# Patient Record
Sex: Female | Born: 1985 | Race: White | Hispanic: No | Marital: Married | Smoking: Never smoker
Health system: Southern US, Community
[De-identification: ages and names within clinical notes are randomized; demographics above are authoritative.]

## PROBLEM LIST (undated history)

## (undated) ENCOUNTER — Inpatient Hospital Stay (HOSPITAL_COMMUNITY): Payer: Self-pay

---
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
## 2015-12-06 NOTE — L&D Delivery Note (Signed)
Delivery Note At 3:46 AM a healthy female was delivered via Vaginal, Spontaneous Delivery (Presentation: ROA).  APGAR: 7, 9; weight pending .  Placenta status: S/I.  Cord: 3V with the following complications: None.  Cord pH: n/a  Anesthesia:  Epidural Episiotomy: None Lacerations:  2nd degree Suture Repair: 3.0 vicryl Est. Blood Loss (mL): 400  Mom to postpartum.  Baby to Couplet care / Skin to Skin.  Paula [name] 09/29/2016, 4:37 AM

## 2016-03-15 LAB — OB RESULTS CONSOLE HEPATITIS B SURFACE ANTIGEN: Hepatitis B Surface Ag: NEGATIVE

## 2016-03-15 LAB — OB RESULTS CONSOLE GC/CHLAMYDIA
Chlamydia: NEGATIVE
Gonorrhea: NEGATIVE

## 2016-03-15 LAB — OB RESULTS CONSOLE RPR: RPR: NONREACTIVE

## 2016-03-15 LAB — OB RESULTS CONSOLE RUBELLA ANTIBODY, IGM: RUBELLA: IMMUNE

## 2016-03-15 LAB — OB RESULTS CONSOLE ANTIBODY SCREEN: Antibody Screen: NEGATIVE

## 2016-03-15 LAB — OB RESULTS CONSOLE HIV ANTIBODY (ROUTINE TESTING): HIV: NONREACTIVE

## 2016-03-15 LAB — OB RESULTS CONSOLE ABO/RH: RH TYPE: POSITIVE

## 2016-09-21 ENCOUNTER — Encounter (HOSPITAL_COMMUNITY): Payer: Self-pay | Admitting: *Deleted

## 2016-09-21 ENCOUNTER — Telehealth (HOSPITAL_COMMUNITY): Payer: Self-pay | Admitting: *Deleted

## 2016-09-21 LAB — OB RESULTS CONSOLE GBS: STREP GROUP B AG: NEGATIVE

## 2016-09-21 NOTE — Telephone Encounter (Signed)
Preadmission screen  

## 2016-09-22 ENCOUNTER — Telehealth (HOSPITAL_COMMUNITY): Payer: Self-pay | Admitting: *Deleted

## 2016-09-22 ENCOUNTER — Inpatient Hospital Stay (HOSPITAL_COMMUNITY)
Admission: AD | Admit: 2016-09-22 | Discharge: 2016-09-22 | Disposition: A | Discharge status: Home / Self Care | Payer: BLUE CROSS/BLUE SHIELD | Source: Ambulatory Visit | Attending: Obstetrics and Gynecology | Admitting: Obstetrics and Gynecology

## 2016-09-22 ENCOUNTER — Encounter (HOSPITAL_COMMUNITY): Payer: Self-pay | Admitting: *Deleted

## 2016-09-22 DIAGNOSIS — Z3493 Encounter for supervision of normal pregnancy, unspecified, third trimester: Secondary | ICD-10-CM | POA: Diagnosis not present

## 2016-09-22 DIAGNOSIS — Z3A39 39 weeks gestation of pregnancy: Secondary | ICD-10-CM | POA: Insufficient documentation

## 2016-09-22 NOTE — MAU Note (Signed)
PT  SAYS  HER UC HURT BAD  SINCE  9PM.    VE IN OFFICE  ON Tuesday - 1   CM.     DENIES  HSV AND  MRSA.  GBS-   UNSURE

## 2016-09-22 NOTE — Telephone Encounter (Signed)
Preadmission screen  

## 2016-09-22 NOTE — Discharge Instructions (Signed)
Braxton Hicks Contractions °Contractions of the uterus can occur throughout pregnancy. Contractions are not always a sign that you are in labor.  °WHAT ARE BRAXTON HICKS CONTRACTIONS?  °Contractions that occur before labor are called Braxton Hicks contractions, or false labor. Toward the end of pregnancy (32-34 weeks), these contractions can develop more often and may become more forceful. This is not true labor because these contractions do not result in opening (dilatation) and thinning of the cervix. They are sometimes difficult to tell apart from true labor because these contractions can be forceful and people have different pain tolerances. You should not feel embarrassed if you go to the hospital with false labor. Sometimes, the only way to tell if you are in true labor is for your health care provider to look for changes in the cervix. °If there are no prenatal problems or other health problems associated with the pregnancy, it is completely safe to be sent home with false labor and await the onset of true labor. °HOW CAN YOU TELL THE DIFFERENCE BETWEEN TRUE AND FALSE LABOR? °False Labor °· The contractions of false labor are usually shorter and not as hard as those of true labor.   °· The contractions are usually irregular.   °· The contractions are often felt in the front of the lower abdomen and in the groin.   °· The contractions may go away when you walk around or change positions while lying down.   °· The contractions get weaker and are shorter lasting as time goes on.   °· The contractions do not usually become progressively stronger, regular, and closer together as with true labor.   °True Labor °· Contractions in true labor last 30-70 seconds, become very regular, usually become more intense, and increase in frequency.   °· The contractions do not go away with walking.   °· The discomfort is usually felt in the top of the uterus and spreads to the lower abdomen and low back.   °· True labor can be  determined by your health care provider with an exam. This will show that the cervix is dilating and getting thinner.   °WHAT TO REMEMBER °· Keep up with your usual exercises and follow other instructions given by your health care provider.   °· Take medicines as directed by your health care provider.   °· Keep your regular prenatal appointments.   °· Eat and drink lightly if you think you are going into labor.   °· If Braxton Hicks contractions are making you uncomfortable:   °¨ Change your position from lying down or resting to walking, or from walking to resting.   °¨ Sit and rest in a tub of warm water.   °¨ Drink 2-3 glasses of water. Dehydration may cause these contractions.   °¨ Do slow and deep breathing several times an hour.   °WHEN SHOULD I SEEK IMMEDIATE MEDICAL CARE? °Seek immediate medical care if: °· Your contractions become stronger, more regular, and closer together.   °· You have fluid leaking or gushing from your vagina.   °· You have a fever.   °· You pass blood-tinged mucus.   °· You have vaginal bleeding.   °· You have continuous abdominal pain.   °· You have low back pain that you never had before.   °· You feel your baby's head pushing down and causing pelvic pressure.   °· Your baby is not moving as much as it used to.   °  °This information is not intended to replace advice given to you by your health care provider. Make sure you discuss any questions you have with your health care   provider. °  °Document Released: 11/21/2005 Document Revised: 11/26/2013 Document Reviewed: 09/02/2013 °Elsevier Interactive Patient Education ©2016 [employer] ° °

## 2016-09-23 ENCOUNTER — Encounter (HOSPITAL_COMMUNITY): Payer: Self-pay | Admitting: *Deleted

## 2016-09-23 ENCOUNTER — Inpatient Hospital Stay (HOSPITAL_COMMUNITY)
Admission: AD | Admit: 2016-09-23 | Discharge: 2016-09-23 | Disposition: A | Discharge status: Home / Self Care | Payer: BLUE CROSS/BLUE SHIELD | Source: Ambulatory Visit | Attending: Obstetrics and Gynecology | Admitting: Obstetrics and Gynecology

## 2016-09-23 DIAGNOSIS — Z3493 Encounter for supervision of normal pregnancy, unspecified, third trimester: Secondary | ICD-10-CM | POA: Diagnosis not present

## 2016-09-23 DIAGNOSIS — Z3A40 40 weeks gestation of pregnancy: Secondary | ICD-10-CM | POA: Diagnosis not present

## 2016-09-23 NOTE — MAU Note (Addendum)
PT  HAS RETURNED  FROM  WED-  SAYS UC ARE  STRONGER  BUT  NOT CLOSE  TOGETHER .   PT  SAYS  SHE IS REALLY CONCERNED  ABOUT  BROWN  D/C - WHEN SHE CALLED ON- RN - WAS TOLD  TO COME AND GET EXAMINED.

## 2016-09-23 NOTE — MAU Note (Signed)
Was seen MAU early Thurs am and was 2cm and sent home. Cont to have some contractions but denies LOF. Having some brown mucousy d/c

## 2016-09-23 NOTE — Discharge Instructions (Signed)
Braxton Hicks Contractions °Contractions of the uterus can occur throughout pregnancy. Contractions are not always a sign that you are in labor.  °WHAT ARE BRAXTON HICKS CONTRACTIONS?  °Contractions that occur before labor are called Braxton Hicks contractions, or false labor. Toward the end of pregnancy (32-34 weeks), these contractions can develop more often and may become more forceful. This is not true labor because these contractions do not result in opening (dilatation) and thinning of the cervix. They are sometimes difficult to tell apart from true labor because these contractions can be forceful and people have different pain tolerances. You should not feel embarrassed if you go to the hospital with false labor. Sometimes, the only way to tell if you are in true labor is for your health care provider to look for changes in the cervix. °If there are no prenatal problems or other health problems associated with the pregnancy, it is completely safe to be sent home with false labor and await the onset of true labor. °HOW CAN YOU TELL THE DIFFERENCE BETWEEN TRUE AND FALSE LABOR? °False Labor °· The contractions of false labor are usually shorter and not as hard as those of true labor.   °· The contractions are usually irregular.   °· The contractions are often felt in the front of the lower abdomen and in the groin.   °· The contractions may go away when you walk around or change positions while lying down.   °· The contractions get weaker and are shorter lasting as time goes on.   °· The contractions do not usually become progressively stronger, regular, and closer together as with true labor.   °True Labor °· Contractions in true labor last 30-70 seconds, become very regular, usually become more intense, and increase in frequency.   °· The contractions do not go away with walking.   °· The discomfort is usually felt in the top of the uterus and spreads to the lower abdomen and low back.   °· True labor can be  determined by your health care provider with an exam. This will show that the cervix is dilating and getting thinner.   °WHAT TO REMEMBER °· Keep up with your usual exercises and follow other instructions given by your health care provider.   °· Take medicines as directed by your health care provider.   °· Keep your regular prenatal appointments.   °· Eat and drink lightly if you think you are going into labor.   °· If Braxton Hicks contractions are making you uncomfortable:   °¨ Change your position from lying down or resting to walking, or from walking to resting.   °¨ Sit and rest in a tub of warm water.   °¨ Drink 2-3 glasses of water. Dehydration may cause these contractions.   °¨ Do slow and deep breathing several times an hour.   °WHEN SHOULD I SEEK IMMEDIATE MEDICAL CARE? °Seek immediate medical care if: °· Your contractions become stronger, more regular, and closer together.   °· You have fluid leaking or gushing from your vagina.   °· You have a fever.   °· You pass blood-tinged mucus.   °· You have vaginal bleeding.   °· You have continuous abdominal pain.   °· You have low back pain that you never had before.   °· You feel your baby's head pushing down and causing pelvic pressure.   °· Your baby is not moving as much as it used to.   °  °This information is not intended to replace advice given to you by your health care provider. Make sure you discuss any questions you have with your health care   provider. °  °Document Released: 11/21/2005 Document Revised: 11/26/2013 Document Reviewed: 09/02/2013 °Elsevier Interactive Patient Education ©2016 [employer] ° °

## 2016-09-28 ENCOUNTER — Inpatient Hospital Stay (HOSPITAL_COMMUNITY): Payer: BLUE CROSS/BLUE SHIELD | Admitting: Anesthesiology

## 2016-09-28 ENCOUNTER — Inpatient Hospital Stay (HOSPITAL_COMMUNITY)
Admission: RE | Admit: 2016-09-28 | Discharge: 2016-09-30 | DRG: 775 | Disposition: A | Discharge status: Home / Self Care | Payer: BLUE CROSS/BLUE SHIELD | Source: Ambulatory Visit | Attending: Obstetrics & Gynecology | Admitting: Obstetrics & Gynecology

## 2016-09-28 ENCOUNTER — Encounter (HOSPITAL_COMMUNITY): Payer: Self-pay

## 2016-09-28 DIAGNOSIS — Z3490 Encounter for supervision of normal pregnancy, unspecified, unspecified trimester: Secondary | ICD-10-CM

## 2016-09-28 DIAGNOSIS — Z3403 Encounter for supervision of normal first pregnancy, third trimester: Secondary | ICD-10-CM | POA: Diagnosis present

## 2016-09-28 DIAGNOSIS — Z8249 Family history of ischemic heart disease and other diseases of the circulatory system: Secondary | ICD-10-CM | POA: Diagnosis not present

## 2016-09-28 DIAGNOSIS — Z3A39 39 weeks gestation of pregnancy: Secondary | ICD-10-CM | POA: Diagnosis not present

## 2016-09-28 LAB — CBC
HEMATOCRIT: 35 % — AB (ref 36.0–46.0)
Hemoglobin: 12.2 g/dL (ref 12.0–15.0)
MCH: 30 pg (ref 26.0–34.0)
MCHC: 34.9 g/dL (ref 30.0–36.0)
MCV: 86 fL (ref 78.0–100.0)
Platelets: 346 10*3/uL (ref 150–400)
RBC: 4.07 MIL/uL (ref 3.87–5.11)
RDW: 14.9 % (ref 11.5–15.5)
WBC: 15.7 10*3/uL — ABNORMAL HIGH (ref 4.0–10.5)

## 2016-09-28 LAB — TYPE AND SCREEN
ABO/RH(D): A POS
Antibody Screen: NEGATIVE

## 2016-09-28 LAB — ABO/RH: ABO/RH(D): A POS

## 2016-09-28 MED ORDER — DIPHENHYDRAMINE HCL 50 MG/ML IJ SOLN: 12.5000 mg | INTRAMUSCULAR | Status: DC | PRN

## 2016-09-28 MED ORDER — PHENYLEPHRINE 40 MCG/ML (10ML) SYRINGE FOR IV PUSH (FOR BLOOD PRESSURE SUPPORT)
80.0000 ug | PREFILLED_SYRINGE | INTRAVENOUS | Status: DC | PRN
  Filled 2016-09-28: qty 10
  Filled 2016-09-28: qty 5

## 2016-09-28 MED ORDER — ONDANSETRON HCL 4 MG/2ML IJ SOLN: 4.0000 mg | Freq: Four times a day (QID) | INTRAMUSCULAR | Status: DC | PRN

## 2016-09-28 MED ORDER — EPHEDRINE 5 MG/ML INJ
10.0000 mg | INTRAVENOUS | Status: DC | PRN
  Filled 2016-09-28: qty 4

## 2016-09-28 MED ORDER — LACTATED RINGERS IV SOLN
500.0000 mL | Freq: Once | INTRAVENOUS | Status: AC
  Administered 2016-09-28: 500 mL via INTRAVENOUS

## 2016-09-28 MED ORDER — LIDOCAINE HCL (PF) 1 % IJ SOLN
INTRAMUSCULAR | Status: DC | PRN
  Administered 2016-09-28 (×2): 4 mL

## 2016-09-28 MED ORDER — LIDOCAINE HCL (PF) 1 % IJ SOLN
30.0000 mL | INTRAMUSCULAR | Status: DC | PRN
  Filled 2016-09-28: qty 30

## 2016-09-28 MED ORDER — FLEET ENEMA 7-19 GM/118ML RE ENEM: 1.0000 | ENEMA | RECTAL | Status: DC | PRN

## 2016-09-28 MED ORDER — OXYTOCIN 40 UNITS IN LACTATED RINGERS INFUSION - SIMPLE MED
2.5000 [IU]/h | INTRAVENOUS | Status: DC
  Administered 2016-09-29: 2.5 [IU]/h via INTRAVENOUS

## 2016-09-28 MED ORDER — OXYTOCIN 40 UNITS IN LACTATED RINGERS INFUSION - SIMPLE MED
1.0000 m[IU]/min | INTRAVENOUS | Status: DC
  Administered 2016-09-28: 2 m[IU]/min via INTRAVENOUS
  Filled 2016-09-28: qty 1000

## 2016-09-28 MED ORDER — FENTANYL 2.5 MCG/ML BUPIVACAINE 1/10 % EPIDURAL INFUSION (WH - ANES)
14.0000 mL/h | INTRAMUSCULAR | Status: DC | PRN
  Administered 2016-09-28: 12.5 mL/h via EPIDURAL
  Administered 2016-09-28 – 2016-09-29 (×2): 14 mL/h via EPIDURAL
  Filled 2016-09-28 (×3): qty 125

## 2016-09-28 MED ORDER — OXYCODONE-ACETAMINOPHEN 5-325 MG PO TABS: 2.0000 | ORAL_TABLET | ORAL | Status: DC | PRN

## 2016-09-28 MED ORDER — ACETAMINOPHEN 325 MG PO TABS: 650.0000 mg | ORAL_TABLET | ORAL | Status: DC | PRN

## 2016-09-28 MED ORDER — PHENYLEPHRINE 40 MCG/ML (10ML) SYRINGE FOR IV PUSH (FOR BLOOD PRESSURE SUPPORT)
80.0000 ug | PREFILLED_SYRINGE | INTRAVENOUS | Status: DC | PRN
  Filled 2016-09-28: qty 5

## 2016-09-28 MED ORDER — LACTATED RINGERS IV SOLN
INTRAVENOUS | Status: DC
  Administered 2016-09-28 (×2): via INTRAVENOUS

## 2016-09-28 MED ORDER — OXYCODONE-ACETAMINOPHEN 5-325 MG PO TABS: 1.0000 | ORAL_TABLET | ORAL | Status: DC | PRN

## 2016-09-28 MED ORDER — SOD CITRATE-CITRIC ACID 500-334 MG/5ML PO SOLN: 30.0000 mL | ORAL | Status: DC | PRN

## 2016-09-28 MED ORDER — TERBUTALINE SULFATE 1 MG/ML IJ SOLN
0.2500 mg | Freq: Once | INTRAMUSCULAR | Status: DC | PRN
  Filled 2016-09-28: qty 1

## 2016-09-28 MED ORDER — FENTANYL CITRATE (PF) 100 MCG/2ML IJ SOLN: 50.0000 ug | INTRAMUSCULAR | Status: DC | PRN

## 2016-09-28 MED ORDER — LACTATED RINGERS IV SOLN
500.0000 mL | INTRAVENOUS | Status: DC | PRN
  Administered 2016-09-29: 250 mL via INTRAVENOUS

## 2016-09-28 MED ORDER — OXYTOCIN BOLUS FROM INFUSION
500.0000 mL | Freq: Once | INTRAVENOUS | Status: AC
  Administered 2016-09-29: 500 mL via INTRAVENOUS

## 2016-09-28 NOTE — Anesthesia Pain Management Evaluation Note (Signed)
  CRNA Pain Management Visit Note  Patient: Paula [name], 30 y.o., female  "Hello I am a member of the anesthesia team at [facility]. We have an anesthesia team available at all times to provide care throughout the hospital, including epidural management and anesthesia for C-section. I don't know your plan for the delivery whether it a natural birth, water birth, IV sedation, nitrous supplementation, doula or epidural, but we want to meet your pain goals."   1.Was your pain managed to your expectations on prior hospitalizations?   No prior hospitalizations  2.What is your expectation for pain management during this hospitalization?     Epidural  3.How can we help you reach that goal? unsure  Record the patient's initial score and the patient's pain goal.   Pain: 0  Pain Goal: 7 [facility] wants you to be able to say your pain was always managed very well.  Cephus ShellingBURGER,Paula [name] 09/28/2016

## 2016-09-28 NOTE — H&P (Addendum)
Legrand RamsKatherine [name] is a 30 y.o. female presenting for elective IOL at term.  Antepartum course uncomplicated.  GBS negative.   OB History    Gravida Para Term Preterm AB Living   1             SAB TAB Ectopic Multiple Live Births                 History reviewed. No pertinent past medical history. History reviewed. No pertinent surgical history. Family History: family history includes Hypertension in her mother. Social History:  reports that she has never smoked. She has never used smokeless tobacco. She reports that she does not drink alcohol or use drugs.     Maternal Diabetes: No Genetic Screening: Normal Maternal Ultrasounds/Referrals: Normal Fetal Ultrasounds or other Referrals:  None Maternal Substance Abuse:  No Significant Maternal Medications:  None Significant Maternal Lab Results:  Lab values include: Group B Strep negative Other Comments:  None  ROS Maternal Medical History:  Fetal activity: Perceived fetal activity is normal.   Last perceived fetal movement was within the past hour.    Prenatal complications: no prenatal complications Prenatal Complications - Diabetes: none.    Dilation: 3 Effacement (%): 100 Station: -2 Exam by:: Mance Vallejo  AROM clear  Blood pressure 131/85, pulse 84, resp. rate 18, height 5' (1.524 m), weight 150 lb (68 kg), last menstrual period 12/24/2015. Maternal Exam:  Uterine Assessment: Contraction strength is mild.  Contraction frequency is rare.   Abdomen: Patient reports no abdominal tenderness. Fundal height is c/w dates.   Estimated fetal weight is 7#12.   Fetal presentation: vertex  Introitus: Normal vulva. Amniotic fluid character: clear.  Pelvis: adequate for delivery.   Cervix: Cervix evaluated by digital exam.     Physical Exam  Constitutional: She is oriented to person, place, and time. She appears well-developed and well-nourished.  GI: Soft. There is no rebound and no guarding.  Neurological: She is alert and  oriented to person, place, and time.  Skin: Skin is warm and dry.  Psychiatric: She has a normal mood and affect. Her behavior is normal.    Prenatal labs: ABO, Rh: A/Positive/-- (04/11 0000) Antibody: Negative (04/11 0000) Rubella: Immune (04/11 0000) RPR: Nonreactive (04/11 0000)  HBsAg: Negative (04/11 0000)  HIV: Non-reactive (04/11 0000)  GBS: Negative (10/18 0000)   Assessment/Plan: 29yo G1 at 10399w6d for IOL -Pitocin -Epidural when ready -Anticipate NSVD   Paula [name] 09/28/2016, 7:52 AM

## 2016-09-28 NOTE — Anesthesia Preprocedure Evaluation (Signed)
Anesthesia Evaluation  Patient identified by MRN, date of birth, ID band Patient awake    Reviewed: Allergy & Precautions, NPO status , Patient's Chart, lab work & pertinent test results  History of Anesthesia Complications Negative for: history of anesthetic complications  Airway Mallampati: II  TM Distance: >3 FB Neck ROM: Full    Dental no notable dental hx. (+) Dental Advisory Given   Pulmonary neg pulmonary ROS,    Pulmonary exam normal breath sounds clear to auscultation       Cardiovascular negative cardio ROS Normal cardiovascular exam Rhythm:Regular Rate:Normal     Neuro/Psych negative neurological ROS  negative psych ROS   GI/Hepatic negative GI ROS, Neg liver ROS,   Endo/Other  negative endocrine ROS  Renal/GU negative Renal ROS  negative genitourinary   Musculoskeletal negative musculoskeletal ROS (+)   Abdominal   Peds negative pediatric ROS (+)  Hematology negative hematology ROS (+)   Anesthesia Other Findings   Reproductive/Obstetrics (+) Pregnancy                             Anesthesia Physical Anesthesia Plan  ASA: II  Anesthesia Plan: Epidural   Post-op Pain Management:    Induction:   Airway Management Planned:   Additional Equipment:   Intra-op Plan:   Post-operative Plan:   Informed Consent: I have reviewed the patients History and Physical, chart, labs and discussed the procedure including the risks, benefits and alternatives for the proposed anesthesia with the patient or authorized representative who has indicated his/her understanding and acceptance.   Dental advisory given  Plan Discussed with: CRNA  Anesthesia Plan Comments:         Anesthesia Quick Evaluation  

## 2016-09-28 NOTE — Anesthesia Procedure Notes (Signed)
Epidural Patient location during procedure: OB  Staffing Anesthesiologist: Angelin Cutrone Performed: anesthesiologist   Preanesthetic Checklist Completed: patient identified, site marked, surgical consent, pre-op evaluation, timeout performed, IV checked, risks and benefits discussed and monitors and equipment checked  Epidural Patient position: sitting Prep: site prepped and draped and DuraPrep Patient monitoring: continuous pulse ox and blood pressure Approach: midline Location: L3-L4 Injection technique: LOR saline  Needle:  Needle type: Tuohy  Needle gauge: 17 G Needle length: 9 cm and 9 Needle insertion depth: 5 cm cm Catheter type: closed end flexible Catheter size: 19 Gauge Catheter at skin depth: 10 cm Test dose: negative  Assessment Events: blood not aspirated, injection not painful, no injection resistance, negative IV test and no paresthesia  Additional Notes Patient identified. Risks/Benefits/Options discussed with patient including but not limited to bleeding, infection, nerve damage, paralysis, failed block, incomplete pain control, headache, blood pressure changes, nausea, vomiting, reactions to medication both or allergic, itching and postpartum back pain. Confirmed with bedside nurse the patient's most recent platelet count. Confirmed with patient that they are not currently taking any anticoagulation, have any bleeding history or any family history of bleeding disorders. Patient expressed understanding and wished to proceed. All questions were answered. Sterile technique was used throughout the entire procedure. Please see nursing notes for vital signs. Test dose was given through epidural catheter and negative prior to continuing to dose epidural or start infusion. Warning signs of high block given to the patient including shortness of breath, tingling/numbness in hands, complete motor block, or any concerning symptoms with instructions to call for help. Patient was  given instructions on fall risk and not to get out of bed. All questions and concerns addressed with instructions to call with any issues or inadequate analgesia.        

## 2016-09-29 ENCOUNTER — Inpatient Hospital Stay (HOSPITAL_COMMUNITY): Admission: RE | Admit: 2016-09-29 | Payer: BLUE CROSS/BLUE SHIELD | Source: Ambulatory Visit

## 2016-09-29 ENCOUNTER — Encounter (HOSPITAL_COMMUNITY): Payer: Self-pay

## 2016-09-29 LAB — RPR: RPR: NONREACTIVE

## 2016-09-29 MED ORDER — OXYCODONE-ACETAMINOPHEN 5-325 MG PO TABS: 1.0000 | ORAL_TABLET | ORAL | Status: DC | PRN

## 2016-09-29 MED ORDER — IBUPROFEN 600 MG PO TABS
600.0000 mg | ORAL_TABLET | Freq: Four times a day (QID) | ORAL | Status: DC
  Administered 2016-09-29 – 2016-09-30 (×6): 600 mg via ORAL
  Filled 2016-09-29 (×6): qty 1

## 2016-09-29 MED ORDER — DIPHENHYDRAMINE HCL 25 MG PO CAPS: 25.0000 mg | ORAL_CAPSULE | Freq: Four times a day (QID) | ORAL | Status: DC | PRN

## 2016-09-29 MED ORDER — BENZOCAINE-MENTHOL 20-0.5 % EX AERO
1.0000 "application " | INHALATION_SPRAY | CUTANEOUS | Status: DC | PRN
  Administered 2016-09-30: 1 via TOPICAL
  Filled 2016-09-29: qty 56

## 2016-09-29 MED ORDER — ONDANSETRON HCL 4 MG/2ML IJ SOLN: 4.0000 mg | INTRAMUSCULAR | Status: DC | PRN

## 2016-09-29 MED ORDER — WITCH HAZEL-GLYCERIN EX PADS: 1.0000 "application " | MEDICATED_PAD | CUTANEOUS | Status: DC | PRN

## 2016-09-29 MED ORDER — ONDANSETRON HCL 4 MG PO TABS: 4.0000 mg | ORAL_TABLET | ORAL | Status: DC | PRN

## 2016-09-29 MED ORDER — ACETAMINOPHEN 325 MG PO TABS: 650.0000 mg | ORAL_TABLET | ORAL | Status: DC | PRN

## 2016-09-29 MED ORDER — SIMETHICONE 80 MG PO CHEW: 80.0000 mg | CHEWABLE_TABLET | ORAL | Status: DC | PRN

## 2016-09-29 MED ORDER — TETANUS-DIPHTH-ACELL PERTUSSIS 5-2.5-18.5 LF-MCG/0.5 IM SUSP: 0.5000 mL | Freq: Once | INTRAMUSCULAR | Status: DC

## 2016-09-29 MED ORDER — ZOLPIDEM TARTRATE 5 MG PO TABS: 5.0000 mg | ORAL_TABLET | Freq: Every evening | ORAL | Status: DC | PRN

## 2016-09-29 MED ORDER — SENNOSIDES-DOCUSATE SODIUM 8.6-50 MG PO TABS
2.0000 | ORAL_TABLET | ORAL | Status: DC
  Administered 2016-09-29: 2 via ORAL
  Filled 2016-09-29: qty 2

## 2016-09-29 MED ORDER — OXYCODONE-ACETAMINOPHEN 5-325 MG PO TABS: 2.0000 | ORAL_TABLET | ORAL | Status: DC | PRN

## 2016-09-29 MED ORDER — COCONUT OIL OIL: 1.0000 "application " | TOPICAL_OIL | Status: DC | PRN

## 2016-09-29 MED ORDER — DIBUCAINE 1 % RE OINT: 1.0000 "application " | TOPICAL_OINTMENT | RECTAL | Status: DC | PRN

## 2016-09-29 MED ORDER — PRENATAL MULTIVITAMIN CH
1.0000 | ORAL_TABLET | Freq: Every day | ORAL | Status: DC
  Administered 2016-09-29 – 2016-09-30 (×2): 1 via ORAL
  Filled 2016-09-29 (×3): qty 1

## 2016-09-29 NOTE — Anesthesia Postprocedure Evaluation (Signed)
Anesthesia Post Note  Patient: Paula [name]  Procedure(s) Performed: * No procedures listed *  Patient location during evaluation: Mother Baby Anesthesia Type: Epidural Level of consciousness: awake and alert Pain management: pain level controlled Vital Signs Assessment: post-procedure vital signs reviewed and stable Respiratory status: spontaneous breathing, nonlabored ventilation and respiratory function stable Cardiovascular status: stable Postop Assessment: no headache, no backache and epidural receding Anesthetic complications: no     Last Vitals:  Vitals:   09/29/16 0645 09/29/16 1100  BP: (!) 143/84 (!) 106/58  Pulse: 76 80  Resp: 20 18  Temp: 37.3 C 37.1 C    Last Pain:  Vitals:   09/29/16 1105  TempSrc:   PainSc: 0-No pain   Pain Goal:                 Keyah Blizard

## 2016-09-29 NOTE — Progress Notes (Signed)
Post Partum Day 0 Subjective: no complaints, up ad lib, voiding, tolerating PO and + flatus Denies HA, blurred vision Objective: Blood pressure (!) 143/84, pulse 76, temperature 99.2 F (37.3 C), resp. rate 20, height 5' (1.524 m), weight 150 lb (68 kg), last menstrual period 12/24/2015, SpO2 98 %, unknown if currently breastfeeding.  Physical Exam:  General: alert and cooperative Lochia: appropriate Uterine Fundus: firm Incision: healing well, labial edema noted DVT Evaluation: No evidence of DVT seen on physical exam. Negative Homan's sign. No cords or calf tenderness. No significant calf/ankle edema.   Recent Labs  09/28/16 0745  HGB 12.2  HCT 35.0*    Assessment/Plan: Plan for discharge tomorrow and Breastfeeding   LOS: 1 day   Kerrie Timm G 09/29/2016, 8:07 AM

## 2016-09-29 NOTE — Lactation Note (Signed)
This note was copied from a baby's chart. Lactation Consultation Note  Patient Name: Paula [name] RamsKatherine Bitting UJWJX'BToday's Date: 09/29/2016 Reason for consult: Initial assessment  Visited with G1P1 Mom and FOB, baby 8 hrs old.  Baby sleeping STS on Mom's chest.   Mom has compressible areola, and flat nipples that do evert with stimulation a little.  Assisted with trying to entice baby to latch, as baby hasn't latched yet since birth.  Demonstrated breast massage, and hand expression to Mom.  A drop of colostrum expressed, and Mom put on baby's lips.  Baby becomes fussy and screams when trying to help her latch.  Mom sandwiching breast well to facilitate an areolar latch.  Teaching done while assisting.  Tried in football hold as well as laid back positions, and cross cradle.  Baby not interested at this point.  Discussed breast shells when she puts her bra on.  Demonstrated how to put these on.  Talked about pumping if baby continues to be tired and not interested in latching.  Mom wanting to rest at present.  Encouraged massage and hand expression to stimulate her colostrum.  Baby only calm when left to sleep on Mom's chest. Brochure given and told parents about Lactation services available to them.  Encouraged them to call when baby starts cueing to feed.  Encouraged STS and cue based feedings. Lactation to assist and follow-up prn and tomorrow.  Consult Status Consult Status: Follow-up Date: 09/30/16 Follow-up type: In-patient    Judee ClaraSmith, Junko Ohagan E 09/29/2016, 12:02 PM

## 2016-09-29 NOTE — Lactation Note (Signed)
This note was copied from a baby's chart. Lactation Consultation Note: Mother paged for latch assistance. Infant lying in cradle hold in mothers arms.  Assist mother in positioning infant in cross-cradle hold for better support.  Advised mother to use both hand for good support.  Infant latched on for a few mins  Mother aware of a good latch with rhythmic suckling Infant on and off so we switched to football hold on the alternate breast Infant latched on using a tea-cup hold. Sustained latch on and off for 5 mins. Mother describes good strong tugging.  Lots of teaching with mother on cue base feeding and positioning.  Mother receptive to all teaching. Advised to cue base feed at least 8-12 times in 24 hours.   Patient Name: Girl Legrand RamsKatherine Casaus WUJWJ'XToday's Date: 09/29/2016 Reason for consult: Initial assessment   Maternal Data Formula Feeding for Exclusion: No Has patient been taught Hand Expression?: Yes Does the patient have breastfeeding experience prior to this delivery?: No  Feeding Feeding Type: Breast Fed  LATCH Score/Interventions Latch: Too sleepy or reluctant, no latch achieved, no sucking elicited. Intervention(s): Skin to skin;Teach feeding cues;Waking techniques  Audible Swallowing: None Intervention(s): Skin to skin;Hand expression  Type of Nipple: Flat Intervention(s): Shells;Reverse pressure;Hand pump  Comfort (Breast/Nipple): Soft / non-tender     Hold (Positioning): Assistance needed to correctly position infant at breast and maintain latch. Intervention(s): Breastfeeding basics reviewed;Support Pillows;Position options;Skin to skin  LATCH Score: 4  Lactation Tools Discussed/Used WIC Program: No   Consult Status Consult Status: Follow-up Date: 09/30/16 Follow-up type: In-patient    Stevan BornKendrick, Diahann Guajardo [facility] 09/29/2016, 1:48 PM

## 2016-09-30 LAB — CBC
HEMATOCRIT: 28.8 % — AB (ref 36.0–46.0)
HEMOGLOBIN: 10 g/dL — AB (ref 12.0–15.0)
MCH: 29.9 pg (ref 26.0–34.0)
MCHC: 34.7 g/dL (ref 30.0–36.0)
MCV: 86.2 fL (ref 78.0–100.0)
Platelets: 309 10*3/uL (ref 150–400)
RBC: 3.34 MIL/uL — AB (ref 3.87–5.11)
RDW: 15 % (ref 11.5–15.5)
WBC: 11.7 10*3/uL — ABNORMAL HIGH (ref 4.0–10.5)

## 2016-09-30 MED ORDER — IBUPROFEN 600 MG PO TABS: 600.0000 mg | ORAL_TABLET | Freq: Four times a day (QID) | ORAL | 0 refills | Status: AC

## 2016-09-30 NOTE — Lactation Note (Signed)
This note was copied from a baby's chart. Lactation Consultation Note  Patient Name: Paula [name]: 09/30/2016 Reason for consult: Follow-up assessment  Visited with Mom on day of discharge, baby 5335 hrs old.  Mom complaining of some soreness with latch.  Baby's weight down 3.4% and had 4 stools, and 3 voids in last 24 hrs.  Baby very fussy at initial latch, not wanting to open widely.  Mom using football hold on right breast, and good breast and head support.  Mom very relaxed with baby.  After a few attempts, baby was able to attain a deep areolar latch, but after a few suck burst, fell asleep.  Baby switched to cross cradle hold on left breast.  Baby latched easily.  Showed Mom how to assess lower lip to be flanged, and how to tug on chin to uncurl it.  Baby initially non-nutritive, but after some alternate breast compression, became much more vigorous.  Identified swallows in the suck pattern for Mom to look for.  Baby fed for 30 minutes on right breast, and 15 minutes on left back in football hold.  No discomfort felt, and nipples look rounded after baby comes off.  Mom has a Medela PIS at home, and advised to pump following sleepy feedings with little swallowing noted.  Mom knows how to dropper/syringe feed baby her pumped colostrum.  STS recommended, and feeding often on cue.  Goal is to feed baby >8 times in 24 hrs.  Cluster feeding reviewed.  Engorgement prevention and treatment discussed.  Mom told about OP lactation services available to her.  OP appointment made for 11/3 @ 1pm for reassurance, but may cancel if all goes well.     Consult Status Consult Status: Follow-up [name]: 10/07/16 Follow-up type: In-patient    Judee ClaraSmith, Tria Noguera E 09/30/2016, 4:21 PM

## 2016-09-30 NOTE — Progress Notes (Signed)
Post Partum Day 1 Subjective: no complaints, up ad lib, voiding, tolerating PO and + flatus  Objective: Blood pressure 115/75, pulse (!) 58, temperature 98.3 F (36.8 C), temperature source Oral, resp. rate 18, height 5' (1.524 m), weight 150 lb (68 kg), last menstrual period 12/24/2015, SpO2 99 %, unknown if currently breastfeeding.  Physical Exam:  General: alert and cooperative Lochia: appropriate Uterine Fundus: firm Incision: healing well DVT Evaluation: No evidence of DVT seen on physical exam. Negative Homan's sign. No cords or calf tenderness. No significant calf/ankle edema.   Recent Labs  09/28/16 0745 09/30/16 0539  HGB 12.2 10.0*  HCT 35.0* 28.8*    Assessment/Plan: Breastfeeding, undecided regarding discharge this am. Discharge teaching reviewed and brochure given   LOS: 2 days   Shilo Philipson G 09/30/2016, 8:19 AM

## 2016-10-01 NOTE — Discharge Summary (Signed)
Obstetric Discharge Summary Reason for Admission: induction of labor Prenatal Procedures: none Intrapartum Procedures: spontaneous vaginal delivery Postpartum Procedures: none Complications-Operative and Postpartum: none Hemoglobin  Date Value Ref Range Status  09/30/2016 10.0 (L) 12.0 - 15.0 g/dL Final   HCT  Date Value Ref Range Status  09/30/2016 28.8 (L) 36.0 - 46.0 % Final    Physical Exam:  General: alert Lochia: appropriate Uterine Fundus: firm Incision: healing well DVT Evaluation: No evidence of DVT seen on physical exam.  Discharge Diagnoses: Term Pregnancy-delivered  Discharge Information: Date: 10/01/2016 Activity: pelvic rest Diet: routine Medications: PNV and Ibuprofen Condition: stable Instructions: refer to practice specific booklet Discharge to: home Follow-up Information    [facility]. Schedule an appointment as soon as possible for a visit in 6 week(s).   Contact information: [address] [place] [address] [phone]           Newborn Data: Live born female  Birth Weight: 8 lb 15.9 oz (4080 g) APGAR: 7, 9  Home with mother.  Meriel PicaHOLLAND,Nela Bascom M 10/01/2016, 8:51 AM

## 2016-10-07 ENCOUNTER — Ambulatory Visit (HOSPITAL_COMMUNITY): Payer: BLUE CROSS/BLUE SHIELD

## 2019-09-03 ENCOUNTER — Other Ambulatory Visit: Payer: Self-pay

## 2019-09-03 DIAGNOSIS — Z20822 Contact with and (suspected) exposure to covid-19: Secondary | ICD-10-CM

## 2019-09-04 LAB — NOVEL CORONAVIRUS, NAA: SARS-CoV-2, NAA: NOT DETECTED

## 2019-10-10 ENCOUNTER — Other Ambulatory Visit: Payer: Self-pay | Admitting: Obstetrics and Gynecology

## 2019-10-10 DIAGNOSIS — N6320 Unspecified lump in the left breast, unspecified quadrant: Secondary | ICD-10-CM

## 2019-10-17 ENCOUNTER — Ambulatory Visit
Admission: RE | Admit: 2019-10-17 | Discharge: 2019-10-17 | Disposition: A | Discharge status: Home / Self Care | Payer: BC Managed Care – PPO | Source: Ambulatory Visit | Attending: Obstetrics and Gynecology | Admitting: Obstetrics and Gynecology

## 2019-10-17 ENCOUNTER — Other Ambulatory Visit: Payer: Self-pay

## 2019-10-17 DIAGNOSIS — N6320 Unspecified lump in the left breast, unspecified quadrant: Secondary | ICD-10-CM

## 2019-10-18 ENCOUNTER — Other Ambulatory Visit: Payer: BLUE CROSS/BLUE SHIELD

## 2019-10-22 ENCOUNTER — Other Ambulatory Visit: Payer: BLUE CROSS/BLUE SHIELD

## 2019-11-12 ENCOUNTER — Other Ambulatory Visit: Payer: Self-pay | Admitting: General Surgery

## 2019-11-12 DIAGNOSIS — N6452 Nipple discharge: Secondary | ICD-10-CM

## 2019-11-25 LAB — OB RESULTS CONSOLE ANTIBODY SCREEN: Antibody Screen: NEGATIVE

## 2019-11-25 LAB — OB RESULTS CONSOLE ABO/RH: RH Type: POSITIVE

## 2019-11-25 LAB — OB RESULTS CONSOLE RPR: RPR: NONREACTIVE

## 2019-11-25 LAB — OB RESULTS CONSOLE HIV ANTIBODY (ROUTINE TESTING): HIV: NONREACTIVE

## 2019-11-25 LAB — OB RESULTS CONSOLE HEPATITIS B SURFACE ANTIGEN: Hepatitis B Surface Ag: NEGATIVE

## 2019-11-25 LAB — OB RESULTS CONSOLE RUBELLA ANTIBODY, IGM: Rubella: IMMUNE

## 2019-12-13 ENCOUNTER — Other Ambulatory Visit: Payer: BC Managed Care – PPO

## 2020-01-06 ENCOUNTER — Other Ambulatory Visit: Payer: Self-pay | Admitting: General Surgery

## 2020-01-06 ENCOUNTER — Ambulatory Visit
Admission: RE | Admit: 2020-01-06 | Discharge: 2020-01-06 | Disposition: A | Discharge status: Home / Self Care | Payer: BC Managed Care – PPO | Source: Ambulatory Visit | Attending: General Surgery | Admitting: General Surgery

## 2020-01-06 DIAGNOSIS — N6452 Nipple discharge: Secondary | ICD-10-CM

## 2020-01-31 ENCOUNTER — Ambulatory Visit
Admission: RE | Admit: 2020-01-31 | Discharge: 2020-01-31 | Disposition: A | Discharge status: Home / Self Care | Payer: BC Managed Care – PPO | Source: Ambulatory Visit | Attending: General Surgery | Admitting: General Surgery

## 2020-01-31 DIAGNOSIS — N6452 Nipple discharge: Secondary | ICD-10-CM

## 2020-05-22 LAB — OB RESULTS CONSOLE GBS: GBS: NEGATIVE

## 2020-06-04 ENCOUNTER — Encounter (HOSPITAL_COMMUNITY): Payer: Self-pay | Admitting: *Deleted

## 2020-06-04 ENCOUNTER — Telehealth (HOSPITAL_COMMUNITY): Payer: Self-pay | Admitting: *Deleted

## 2020-06-04 NOTE — Telephone Encounter (Signed)
Preadmission screen  

## 2020-06-12 ENCOUNTER — Inpatient Hospital Stay (HOSPITAL_COMMUNITY): Payer: BC Managed Care – PPO

## 2020-06-13 ENCOUNTER — Other Ambulatory Visit (HOSPITAL_COMMUNITY)
Admission: RE | Admit: 2020-06-13 | Discharge: 2020-06-13 | Disposition: A | Discharge status: Home / Self Care | Payer: BC Managed Care – PPO | Source: Ambulatory Visit | Attending: Obstetrics and Gynecology | Admitting: Obstetrics and Gynecology

## 2020-06-13 DIAGNOSIS — Z01812 Encounter for preprocedural laboratory examination: Secondary | ICD-10-CM | POA: Insufficient documentation

## 2020-06-13 DIAGNOSIS — Z20822 Contact with and (suspected) exposure to covid-19: Secondary | ICD-10-CM | POA: Insufficient documentation

## 2020-06-13 LAB — SARS CORONAVIRUS 2 (TAT 6-24 HRS): SARS Coronavirus 2: NEGATIVE

## 2020-06-15 NOTE — H&P (Signed)
Carmeline Kowal is a 34 y.o. female presenting for IOL at term. Pregnancy complicated by 8# 15 oz baby with pregnancy #1. OB History    Gravida  2   Para  1   Term  1   Preterm      AB      Living  1     SAB      TAB      Ectopic      Multiple  0   Live Births  1          No past medical history on file. No past surgical history on file. Family History: family history includes Hypertension in her mother; Thyroid disease in her mother. Social History:  reports that she has never smoked. She has never used smokeless tobacco. She reports that she does not drink alcohol and does not use drugs.     Maternal Diabetes: No Genetic Screening: Normal Maternal Ultrasounds/Referrals: Normal Fetal Ultrasounds or other Referrals:  None Maternal Substance Abuse:  No Significant Maternal Medications:  None Significant Maternal Lab Results:  Group B Strep negative Other Comments:  None  Review of Systems  Eyes: Negative for visual disturbance.  Gastrointestinal: Negative for abdominal pain.  Neurological: Negative for headaches.   Maternal Medical History:  Fetal activity: Perceived fetal activity is normal.        Last menstrual period 09/13/2019, unknown if currently breastfeeding. Maternal Exam:  Abdomen: Fetal presentation: vertex     Physical Exam Cardiovascular:     Rate and Rhythm: Normal rate.  Pulmonary:     Effort: Pulmonary effort is normal.  Abdominal:     Tenderness: There is no abdominal tenderness.     Prenatal labs: ABO, Rh: A/Positive/-- (12/21 0000) Antibody: Negative (12/21 0000) Rubella: Immune (12/21 0000) RPR: Nonreactive (12/21 0000)  HBsAg: Negative (12/21 0000)  HIV: Non-reactive (12/21 0000)  GBS: Negative/-- (06/18 0000)   Assessment/Plan: 34 yo G2P1 for IOL   Roselle Locus II 06/15/2020, 6:33 PM

## 2020-06-16 ENCOUNTER — Inpatient Hospital Stay (HOSPITAL_COMMUNITY)
Admission: RE | Admit: 2020-06-16 | Discharge: 2020-06-18 | DRG: 807 | Disposition: A | Discharge status: Home / Self Care | Payer: BC Managed Care – PPO | Attending: Obstetrics and Gynecology | Admitting: Obstetrics and Gynecology

## 2020-06-16 ENCOUNTER — Inpatient Hospital Stay (HOSPITAL_COMMUNITY): Payer: BC Managed Care – PPO

## 2020-06-16 ENCOUNTER — Other Ambulatory Visit: Payer: Self-pay

## 2020-06-16 ENCOUNTER — Inpatient Hospital Stay (HOSPITAL_COMMUNITY): Payer: BC Managed Care – PPO | Admitting: Anesthesiology

## 2020-06-16 ENCOUNTER — Encounter (HOSPITAL_COMMUNITY): Payer: Self-pay | Admitting: Obstetrics and Gynecology

## 2020-06-16 DIAGNOSIS — O26893 Other specified pregnancy related conditions, third trimester: Secondary | ICD-10-CM | POA: Diagnosis present

## 2020-06-16 DIAGNOSIS — Z3A39 39 weeks gestation of pregnancy: Secondary | ICD-10-CM

## 2020-06-16 DIAGNOSIS — Z3490 Encounter for supervision of normal pregnancy, unspecified, unspecified trimester: Secondary | ICD-10-CM

## 2020-06-16 DIAGNOSIS — Z20822 Contact with and (suspected) exposure to covid-19: Secondary | ICD-10-CM | POA: Diagnosis present

## 2020-06-16 LAB — CBC
HCT: 33.5 % — ABNORMAL LOW (ref 36.0–46.0)
Hemoglobin: 11.1 g/dL — ABNORMAL LOW (ref 12.0–15.0)
MCH: 29.7 pg (ref 26.0–34.0)
MCHC: 33.1 g/dL (ref 30.0–36.0)
MCV: 89.6 fL (ref 80.0–100.0)
Platelets: 254 10*3/uL (ref 150–400)
RBC: 3.74 MIL/uL — ABNORMAL LOW (ref 3.87–5.11)
RDW: 13.3 % (ref 11.5–15.5)
WBC: 12.5 10*3/uL — ABNORMAL HIGH (ref 4.0–10.5)
nRBC: 0 % (ref 0.0–0.2)

## 2020-06-16 LAB — RPR: RPR Ser Ql: NONREACTIVE

## 2020-06-16 LAB — TYPE AND SCREEN
ABO/RH(D): A POS
Antibody Screen: NEGATIVE

## 2020-06-16 MED ORDER — LACTATED RINGERS IV SOLN: 500.0000 mL | INTRAVENOUS | Status: DC | PRN

## 2020-06-16 MED ORDER — WITCH HAZEL-GLYCERIN EX PADS: 1.0000 "application " | MEDICATED_PAD | CUTANEOUS | Status: DC | PRN

## 2020-06-16 MED ORDER — PHENYLEPHRINE 40 MCG/ML (10ML) SYRINGE FOR IV PUSH (FOR BLOOD PRESSURE SUPPORT): 80.0000 ug | PREFILLED_SYRINGE | INTRAVENOUS | Status: DC | PRN

## 2020-06-16 MED ORDER — COCONUT OIL OIL: 1.0000 "application " | TOPICAL_OIL | Status: DC | PRN

## 2020-06-16 MED ORDER — ACETAMINOPHEN 325 MG PO TABS: 650.0000 mg | ORAL_TABLET | ORAL | Status: DC | PRN

## 2020-06-16 MED ORDER — LACTATED RINGERS IV SOLN: INTRAVENOUS | Status: DC

## 2020-06-16 MED ORDER — TERBUTALINE SULFATE 1 MG/ML IJ SOLN: 0.2500 mg | Freq: Once | INTRAMUSCULAR | Status: DC | PRN

## 2020-06-16 MED ORDER — DIPHENHYDRAMINE HCL 50 MG/ML IJ SOLN: 12.5000 mg | INTRAMUSCULAR | Status: DC | PRN

## 2020-06-16 MED ORDER — SODIUM CHLORIDE (PF) 0.9 % IJ SOLN
INTRAMUSCULAR | Status: DC | PRN
  Administered 2020-06-16: 12 mL/h via EPIDURAL

## 2020-06-16 MED ORDER — FENTANYL-BUPIVACAINE-NACL 0.5-0.125-0.9 MG/250ML-% EP SOLN
EPIDURAL | Status: AC
  Filled 2020-06-16: qty 250

## 2020-06-16 MED ORDER — PRENATAL MULTIVITAMIN CH
1.0000 | ORAL_TABLET | Freq: Every day | ORAL | Status: DC
  Administered 2020-06-16 – 2020-06-17 (×2): 1 via ORAL
  Filled 2020-06-16 (×2): qty 1

## 2020-06-16 MED ORDER — ONDANSETRON HCL 4 MG/2ML IJ SOLN: 4.0000 mg | INTRAMUSCULAR | Status: DC | PRN

## 2020-06-16 MED ORDER — OXYCODONE HCL 5 MG PO TABS: 5.0000 mg | ORAL_TABLET | ORAL | Status: DC | PRN

## 2020-06-16 MED ORDER — OXYTOCIN BOLUS FROM INFUSION
333.0000 mL | Freq: Once | INTRAVENOUS | Status: AC
  Administered 2020-06-16: 333 mL via INTRAVENOUS

## 2020-06-16 MED ORDER — FENTANYL CITRATE (PF) 100 MCG/2ML IJ SOLN
50.0000 ug | INTRAMUSCULAR | Status: DC | PRN
  Administered 2020-06-16: 50 ug via INTRAVENOUS
  Filled 2020-06-16: qty 2

## 2020-06-16 MED ORDER — ONDANSETRON HCL 4 MG PO TABS: 4.0000 mg | ORAL_TABLET | ORAL | Status: DC | PRN

## 2020-06-16 MED ORDER — OXYTOCIN-SODIUM CHLORIDE 30-0.9 UT/500ML-% IV SOLN
2.5000 [IU]/h | INTRAVENOUS | Status: DC
  Administered 2020-06-16: 2.5 [IU]/h via INTRAVENOUS
  Filled 2020-06-16: qty 500

## 2020-06-16 MED ORDER — OXYCODONE-ACETAMINOPHEN 5-325 MG PO TABS: 1.0000 | ORAL_TABLET | ORAL | Status: DC | PRN

## 2020-06-16 MED ORDER — SENNOSIDES-DOCUSATE SODIUM 8.6-50 MG PO TABS
2.0000 | ORAL_TABLET | ORAL | Status: DC
  Administered 2020-06-16 – 2020-06-17 (×2): 2 via ORAL
  Filled 2020-06-16 (×2): qty 2

## 2020-06-16 MED ORDER — EPHEDRINE 5 MG/ML INJ: 10.0000 mg | INTRAVENOUS | Status: DC | PRN

## 2020-06-16 MED ORDER — LIDOCAINE HCL (PF) 1 % IJ SOLN: 30.0000 mL | INTRAMUSCULAR | Status: DC | PRN

## 2020-06-16 MED ORDER — TETANUS-DIPHTH-ACELL PERTUSSIS 5-2.5-18.5 LF-MCG/0.5 IM SUSP: 0.5000 mL | Freq: Once | INTRAMUSCULAR | Status: DC

## 2020-06-16 MED ORDER — FLEET ENEMA 7-19 GM/118ML RE ENEM: 1.0000 | ENEMA | RECTAL | Status: DC | PRN

## 2020-06-16 MED ORDER — BENZOCAINE-MENTHOL 20-0.5 % EX AERO: 1.0000 "application " | INHALATION_SPRAY | CUTANEOUS | Status: DC | PRN

## 2020-06-16 MED ORDER — ONDANSETRON HCL 4 MG/2ML IJ SOLN: 4.0000 mg | Freq: Four times a day (QID) | INTRAMUSCULAR | Status: DC | PRN

## 2020-06-16 MED ORDER — SOD CITRATE-CITRIC ACID 500-334 MG/5ML PO SOLN: 30.0000 mL | ORAL | Status: DC | PRN

## 2020-06-16 MED ORDER — IBUPROFEN 600 MG PO TABS
600.0000 mg | ORAL_TABLET | Freq: Four times a day (QID) | ORAL | Status: DC
  Administered 2020-06-16 – 2020-06-18 (×8): 600 mg via ORAL
  Filled 2020-06-16 (×8): qty 1

## 2020-06-16 MED ORDER — FENTANYL-BUPIVACAINE-NACL 0.5-0.125-0.9 MG/250ML-% EP SOLN: 12.0000 mL/h | EPIDURAL | Status: DC | PRN

## 2020-06-16 MED ORDER — OXYCODONE-ACETAMINOPHEN 5-325 MG PO TABS: 2.0000 | ORAL_TABLET | ORAL | Status: DC | PRN

## 2020-06-16 MED ORDER — MISOPROSTOL 25 MCG QUARTER TABLET
25.0000 ug | ORAL_TABLET | ORAL | Status: DC | PRN
  Administered 2020-06-16: 25 ug via VAGINAL
  Filled 2020-06-16: qty 1

## 2020-06-16 MED ORDER — DIBUCAINE (PERIANAL) 1 % EX OINT: 1.0000 "application " | TOPICAL_OINTMENT | CUTANEOUS | Status: DC | PRN

## 2020-06-16 MED ORDER — ZOLPIDEM TARTRATE 5 MG PO TABS: 5.0000 mg | ORAL_TABLET | Freq: Every evening | ORAL | Status: DC | PRN

## 2020-06-16 MED ORDER — LIDOCAINE-EPINEPHRINE (PF) 2 %-1:200000 IJ SOLN
INTRAMUSCULAR | Status: DC | PRN
  Administered 2020-06-16: 2 mL via EPIDURAL
  Administered 2020-06-16: 3 mL via EPIDURAL

## 2020-06-16 MED ORDER — OXYCODONE HCL 5 MG PO TABS: 10.0000 mg | ORAL_TABLET | ORAL | Status: DC | PRN

## 2020-06-16 MED ORDER — HYDROXYZINE HCL 50 MG PO TABS: 50.0000 mg | ORAL_TABLET | Freq: Four times a day (QID) | ORAL | Status: DC | PRN

## 2020-06-16 MED ORDER — DIPHENHYDRAMINE HCL 25 MG PO CAPS: 25.0000 mg | ORAL_CAPSULE | Freq: Four times a day (QID) | ORAL | Status: DC | PRN

## 2020-06-16 MED ORDER — SIMETHICONE 80 MG PO CHEW: 80.0000 mg | CHEWABLE_TABLET | ORAL | Status: DC | PRN

## 2020-06-16 MED ORDER — LACTATED RINGERS IV SOLN: 500.0000 mL | Freq: Once | INTRAVENOUS | Status: DC

## 2020-06-16 NOTE — Anesthesia Procedure Notes (Signed)
Epidural Patient location during procedure: OB Start time: 06/16/2020 6:00 AM End time: 06/16/2020 6:15 AM  Staffing Anesthesiologist: Elmer Picker, MD Performed: anesthesiologist   Preanesthetic Checklist Completed: patient identified, IV checked, risks and benefits discussed, monitors and equipment checked, pre-op evaluation and timeout performed  Epidural Patient position: sitting Prep: DuraPrep and site prepped and draped Patient monitoring: continuous pulse ox, blood pressure, heart rate and cardiac monitor Approach: midline Location: L3-L4 Injection technique: LOR air  Needle:  Needle type: Tuohy  Needle gauge: 17 G Needle length: 9 cm Needle insertion depth: 5 cm Catheter type: closed end flexible Catheter size: 19 Gauge Catheter at skin depth: 10 cm Test dose: negative  Assessment Sensory level: T8 Events: blood not aspirated, injection not painful, no injection resistance, no paresthesia and negative IV test  Additional Notes Patient identified. Risks/Benefits/Options discussed with patient including but not limited to bleeding, infection, nerve damage, paralysis, failed block, incomplete pain control, headache, blood pressure changes, nausea, vomiting, reactions to medication both or allergic, itching and postpartum back pain. Confirmed with bedside nurse the patient's most recent platelet count. Confirmed with patient that they are not currently taking any anticoagulation, have any bleeding history or any family history of bleeding disorders. Patient expressed understanding and wished to proceed. All questions were answered. Sterile technique was used throughout the entire procedure. Please see nursing notes for vital signs. Test dose was given through epidural catheter and negative prior to continuing to dose epidural or start infusion. Warning signs of high block given to the patient including shortness of breath, tingling/numbness in hands, complete motor block,  or any concerning symptoms with instructions to call for help. Patient was given instructions on fall risk and not to get out of bed. All questions and concerns addressed with instructions to call with any issues or inadequate analgesia.  Reason for block:procedure for pain

## 2020-06-16 NOTE — Progress Notes (Signed)
Delivery Note At 8:29 AM a viable female was delivered via Vaginal, Spontaneous (Presentation:   Occiput Anterior).  APGAR: 8, 9; weight  .   Placenta status: Spontaneous, Intact.  Cord: 3 vessels with the following complications: None.  Cord pH: pending  Anesthesia: Epidural Episiotomy: None Lacerations: 2nd degree small ML lac repaired Suture Repair: 2.0 vicryl rapide Est. Blood Loss (mL): 50  Mom to postpartum.  Baby to Couplet care / Skin to Skin.  Paula [name] II 06/16/2020, 8:40 AM

## 2020-06-16 NOTE — Anesthesia Postprocedure Evaluation (Signed)
Anesthesia Post Note  Patient: Paula [name]  Procedure(s) Performed: AN AD HOC LABOR EPIDURAL     Patient location during evaluation: Mother Baby Anesthesia Type: Epidural Level of consciousness: awake, awake and alert and oriented Pain management: pain level controlled Vital Signs Assessment: post-procedure vital signs reviewed and stable Respiratory status: spontaneous breathing, nonlabored ventilation and respiratory function stable Cardiovascular status: stable Postop Assessment: no headache, patient able to bend at knees, no apparent nausea or vomiting, adequate PO intake and able to ambulate Anesthetic complications: no   No complications documented.  Last Vitals:  Vitals:   06/16/20 1120 06/16/20 1525  BP: 117/74 98/77  Pulse: 74 71  Resp: 16 18  Temp:  37 C  SpO2: 98% 98%    Last Pain:  Vitals:   06/16/20 1525  TempSrc: Oral  PainSc:    Pain Goal:                   Issa Luster

## 2020-06-16 NOTE — Lactation Note (Signed)
This note was copied from a baby's chart. Lactation Consultation Note  Patient Name: Paula [name] RCVEL'F Date: 06/16/2020 Reason for consult: Initial assessment;Term  Initial assessment of 9 hours old baby Paula Careers adviser) of P2 mother with 12 months of breastfeeding experience. Baby is showing hunger cues upon arrival. Mother states breastfeeding is going well and does not reports any problems at this point. Mother latched baby on right breast cradle hold with ease using a nursing pillow. Observed good, deep latch with consistent suckling and audible swallowing. Encouraged mother to maintain good alignment (earn, shoulder, hip). Mother verbalized in agreement.   Reviewed with mother average size of a NB stomach. Encourage to follow babies' hunger and fullness cues. Reviewed importance to offer the breast 8 to 12 times in a 24-hour period for proper stimulation and to establish good milk supply.     Reviewed breastfeeding basics. Discussed milk coming to volume. Reviewed newborn behavior and expectations with mother and encouraged to contact [facility] for support, questions or concerns.    All questions answered at this time.   Maternal Data Formula Feeding for Exclusion: No Has patient been taught Hand Expression?: Yes Does the patient have breastfeeding experience prior to this delivery?: Yes  Feeding Feeding Type: Breast Fed  LATCH Score Latch: Grasps breast easily, tongue down, lips flanged, rhythmical sucking.  Audible Swallowing: Spontaneous and intermittent  Type of Nipple: Everted at rest and after stimulation  Comfort (Breast/Nipple): Soft / non-tender  Hold (Positioning): No assistance needed to correctly position infant at breast.  LATCH Score: 10  Interventions Interventions: Adjust position  Lactation Tools Discussed/Used WIC Program: No   Consult Status Consult Status: Follow-up Date: 06/17/20 Follow-up type: In-patient    Norton Bivins A Higuera  Ancidey 06/16/2020, 6:14 PM

## 2020-06-16 NOTE — Anesthesia Preprocedure Evaluation (Signed)
Anesthesia Evaluation  Patient identified by MRN, date of birth, ID band Patient awake    Reviewed: Allergy & Precautions, NPO status , Patient's Chart, lab work & pertinent test results  Airway Mallampati: II  TM Distance: >3 FB Neck ROM: Full    Dental no notable dental hx.    Pulmonary neg pulmonary ROS,    Pulmonary exam normal breath sounds clear to auscultation       Cardiovascular negative cardio ROS Normal cardiovascular exam Rhythm:Regular Rate:Normal     Neuro/Psych negative neurological ROS  negative psych ROS   GI/Hepatic negative GI ROS, Neg liver ROS,   Endo/Other  negative endocrine ROS  Renal/GU negative Renal ROS  negative genitourinary   Musculoskeletal negative musculoskeletal ROS (+)   Abdominal   Peds  Hematology negative hematology ROS (+)   Anesthesia Other Findings IOL for term gestation  Reproductive/Obstetrics (+) Pregnancy                             Anesthesia Physical Anesthesia Plan  ASA: II  Anesthesia Plan: Epidural   Post-op Pain Management:    Induction:   PONV Risk Score and Plan: Treatment may vary due to age or medical condition  Airway Management Planned: Natural Airway  Additional Equipment:   Intra-op Plan:   Post-operative Plan:   Informed Consent: I have reviewed the patients History and Physical, chart, labs and discussed the procedure including the risks, benefits and alternatives for the proposed anesthesia with the patient or authorized representative who has indicated his/her understanding and acceptance.       Plan Discussed with: Anesthesiologist  Anesthesia Plan Comments: (Patient identified. Risks, benefits, options discussed with patient including but not limited to bleeding, infection, nerve damage, paralysis, failed block, incomplete pain control, headache, blood pressure changes, nausea, vomiting, reactions to  medication, itching, and post partum back pain. Confirmed with bedside nurse the patient's most recent platelet count. Confirmed with the patient that they are not taking any anticoagulation, have any bleeding history or any family history of bleeding disorders. Patient expressed understanding and wishes to proceed. All questions were answered. )        Anesthesia Quick Evaluation

## 2020-06-17 LAB — CBC
HCT: 32.1 % — ABNORMAL LOW (ref 36.0–46.0)
Hemoglobin: 10.5 g/dL — ABNORMAL LOW (ref 12.0–15.0)
MCH: 28.9 pg (ref 26.0–34.0)
MCHC: 32.7 g/dL (ref 30.0–36.0)
MCV: 88.4 fL (ref 80.0–100.0)
Platelets: 250 10*3/uL (ref 150–400)
RBC: 3.63 MIL/uL — ABNORMAL LOW (ref 3.87–5.11)
RDW: 13.2 % (ref 11.5–15.5)
WBC: 11 10*3/uL — ABNORMAL HIGH (ref 4.0–10.5)
nRBC: 0 % (ref 0.0–0.2)

## 2020-06-17 NOTE — Progress Notes (Signed)
PPD # 1 Doing well BP 116/72 (BP Location: Left Arm)    Pulse 62    Temp 97.9 F (36.6 C) (Oral)    Resp 18    Ht 5' (1.524 m)    Wt 70 kg    LMP 09/13/2019    SpO2 100%    Breastfeeding Unknown    BMI 30.14 kg/m  Results for orders placed or performed during the hospital encounter of 06/16/20 (from the past 24 hour(s))  CBC     Status: Abnormal   Collection Time: 06/17/20  5:15 AM  Result Value Ref Range   WBC 11.0 (H) 4.0 - 10.5 K/uL   RBC 3.63 (L) 3.87 - 5.11 MIL/uL   Hemoglobin 10.5 (L) 12.0 - 15.0 g/dL   HCT 61.4 (L) 36 - 46 %   MCV 88.4 80.0 - 100.0 fL   MCH 28.9 26.0 - 34.0 pg   MCHC 32.7 30.0 - 36.0 g/dL   RDW 43.1 54.0 - 08.6 %   Platelets 250 150 - 400 K/uL   nRBC 0.0 0.0 - 0.2 %   Abdomen is soft and non tender  PPD # 1  Doing well Routine care discharge home tomorrow

## 2020-06-17 NOTE — Lactation Note (Signed)
This note was copied from a baby's chart. Lactation Consultation Note  Patient Name: Paula [name] Date: 06/17/2020 Reason for consult: Follow-up assessment  Baby is 49 1/2 hours old  Mom is an experienced breast feeder of 1 year ( now 34 years old )  Mom expressed she wasn't sure the baby was getting enough.  LC reviewed breast feeding basics and reassured her the baby was cluster feeding  And also may have not always had the depth when latched due to areola edema.  LC discussed and recommended reverse pressure prior to latching until the shells  Start to work.  LC instructed mom on the use of shells between feedings except when sleeping  and hand pump ( #24 F and #27 F ). LC recommended starting the shells today after a shower.  And the hand pump if for D/C.  Baby awake and mom latched the baby with minimal assist with the 1st latch and by her self with the second and her mentioned she was comfortable. Swallows noted and increased with breast compressions.  Baby satisfied after the feeding falling asleep.  Per mom will have a DEBP at home.   Maternal Data Has patient been taught Hand Expression?:  (mom able to hand express well prio to latch ) Does the patient have breastfeeding experience prior to this delivery?: Yes  Feeding Feeding Type: Breast Fed  LATCH Score Latch: Grasps breast easily, tongue down, lips flanged, rhythmical sucking.  Audible Swallowing: Spontaneous and intermittent  Type of Nipple: Everted at rest and after stimulation  Comfort (Breast/Nipple): Soft / non-tender  Hold (Positioning): No assistance needed to correctly position infant at breast.  LATCH Score: 10  Interventions Interventions: Breast feeding basics reviewed  Lactation Tools Discussed/Used WIC Program: No Pump Review: Setup, frequency, and cleaning Initiated by:: MAI  Date initiated:: 06/17/20   Consult Status Consult Status: Follow-up Date: 06/18/20 Follow-up  type: In-patient    Matilde Sprang Inocente Krach 06/17/2020, 10:41 AM

## 2020-06-17 NOTE — Progress Notes (Signed)
CSW received and acknowledges consult for EDPS of 9.  Consult screened out due to 9 on EDPS does not warrant a CSW consult.  MOB whom scores are greater than 9/yes to question 10 on Edinburgh Postpartum Depression Screen warrants a CSW consult.    Vashon Arch S. Chenee Munns, MSW, LCSW [facility] [phone]   

## 2020-06-18 NOTE — Lactation Note (Signed)
This note was copied from a baby's chart. Lactation Consultation Note  Patient Name: Paula [name] RCBUL'A Date: 06/18/2020   Infant is 48 hrs old. Infant was finishing a feeding as I entered room. Mom reports some nipple soreness with latching. Mom's nipple had a very slight crease when baby unlatched; we talked about possible causes. Specifics of an asymmetric latch were shown via [employer].   Mom reported that she noticed her breasts felt heavier & denser last night. Mom has excellent veining. Mom's R nipple is atraumatic. Her L nipple has a minute area of pinkness that must be tender to Mom.   Infant has only lost 20 g over the last 24 hrs despite having had 7 voids & 4 stools during that same time period. Mom affirms that stools are now yellow (as mentioned in chart). Mom said she had to supplement with her 1st child; I do not anticipate that she will need to do that this time.   Mom knows how to reach [place] for any post-discharge questions.  Lurline Hare [facility] 06/18/2020, 8:51 AM

## 2020-06-18 NOTE — Discharge Summary (Signed)
Postpartum Discharge Summary  Date of Service updated 06/18/20     Patient Name: Paula [name] DOB: 25-Jan-1986 MRN: [id number]  Date of admission: 06/16/2020 Delivery date:06/16/2020  Delivering provider: Everlene Farrier  Date of discharge: 06/18/2020  Admitting diagnosis: Term pregnancy [Z34.90] Intrauterine pregnancy: [redacted]w[redacted]d    Secondary diagnosis:  Active Problems:   Term pregnancy  Additional problems: none    Discharge diagnosis: Term Pregnancy Delivered                                              Post partum procedures:none Augmentation: AROM and Pitocin Complications: None  Hospital course: Induction of Labor With Vaginal Delivery   34y.o. yo G2P2002 at 34w4das admitted to the hospital 06/16/2020 for induction of labor.  Indication for induction: Favorable cervix at term.  Patient had an uncomplicated labor course as follows: Membrane Rupture Time/Date: 7:10 AM ,06/16/2020   Delivery Method:Vaginal, Spontaneous  Episiotomy: None  Lacerations:  2nd degree  Details of delivery can be found in separate delivery note.  Patient had a routine postpartum course. Patient is discharged home 06/18/20.  Newborn Data: Birth date:06/16/2020  Birth time:8:29 AM  Gender:Female  Living status:Living  Apgars:8 ,9  Weight:3605 g   Magnesium Sulfate received: No BMZ received: No Rhophylac:N/A MMR:N/A T-DaP:Given prenatally Flu: N/A Transfusion:No  Physical exam  Vitals:   06/17/20 0538 06/17/20 1523 06/17/20 2024 06/18/20 0517  BP: 116/72 117/71 116/69 106/61  Pulse: 62 68 60 67  Resp: _0 Temp: 97.9 F (36.6 C) 97.9 F (36.6 C) 98.3 F (36.8 C) 98.4 F (36.9 C)  TempSrc: Oral Oral Oral Oral  SpO2: 100% 98% 97% 98%  Weight:      Height:       General: alert Lochia: appropriate Uterine Fundus: firm Incision: Healing well with no significant drainage DVT Evaluation: No evidence of DVT seen on physical exam. Labs: Lab Results  Component Value Date    WBC 11.0 (H) 06/17/2020   HGB 10.5 (L) 06/17/2020   HCT 32.1 (L) 06/17/2020   MCV 88.4 06/17/2020   PLT 250 06/17/2020   No flowsheet data found. Edinburgh Score: Edinburgh Postnatal Depression Scale Screening Tool 06/17/2020  I have been able to laugh and see the funny side of things. 0  I have looked forward with enjoyment to things. 0  I have blamed myself unnecessarily when things went wrong. 1  I have been anxious or worried for no good reason. 2  I have felt scared or panicky for no good reason. 2  Things have been getting on top of me. 1  I have been so unhappy that I have had difficulty sleeping. 0  I have felt sad or miserable. 2  I have been so unhappy that I have been crying. 1  The thought of harming myself has occurred to me. 0  Edinburgh Postnatal Depression Scale Total 9      After visit meds:  Allergies as of 06/18/2020   No Known Allergies     Medication List    TAKE these medications   ibuprofen 600 MG tablet Commonly known as: ADVIL Take 1 tablet (600 mg total) by mouth every 6 (six) hours.   prenatal multivitamin Tabs tablet Take 1 tablet by mouth daily at 12 noon.        Discharge home  in stable condition Infant Feeding: Breast Infant Disposition:home with mother Discharge instruction: per After Visit Summary and Postpartum booklet. Activity: Advance as tolerated. Pelvic rest for 6 weeks.  Diet: routine diet Anticipated Birth Control: Unsure Postpartum Appointment:6 weeks Additional Postpartum F/U: none Future Appointments:No future appointments. Follow up Visit:      06/18/2020 Tyson Dense, MD

## 2021-04-18 ENCOUNTER — Emergency Department: Payer: BC Managed Care – PPO

## 2021-04-18 ENCOUNTER — Emergency Department
Admission: EM | Admit: 2021-04-18 | Discharge: 2021-04-19 | Disposition: A | Discharge status: Home / Self Care | Payer: BC Managed Care – PPO | Attending: Emergency Medicine | Admitting: Emergency Medicine

## 2021-04-18 ENCOUNTER — Other Ambulatory Visit: Payer: Self-pay

## 2021-04-18 DIAGNOSIS — M62231 Nontraumatic ischemic infarction of muscle, right forearm: Secondary | ICD-10-CM | POA: Diagnosis not present

## 2021-04-18 DIAGNOSIS — M62232 Nontraumatic ischemic infarction of muscle, left forearm: Secondary | ICD-10-CM | POA: Insufficient documentation

## 2021-04-18 DIAGNOSIS — R2233 Localized swelling, mass and lump, upper limb, bilateral: Secondary | ICD-10-CM | POA: Diagnosis present

## 2021-04-18 DIAGNOSIS — R748 Abnormal levels of other serum enzymes: Secondary | ICD-10-CM | POA: Diagnosis not present

## 2021-04-18 DIAGNOSIS — M6282 Rhabdomyolysis: Secondary | ICD-10-CM

## 2021-04-18 LAB — CBC WITH DIFFERENTIAL/PLATELET
Abs Immature Granulocytes: 0.04 10*3/uL (ref 0.00–0.07)
Basophils Absolute: 0.1 10*3/uL (ref 0.0–0.1)
Basophils Relative: 1 %
Eosinophils Absolute: 0.2 10*3/uL (ref 0.0–0.5)
Eosinophils Relative: 1 %
HCT: 39.3 % (ref 36.0–46.0)
Hemoglobin: 13 g/dL (ref 12.0–15.0)
Immature Granulocytes: 0 %
Lymphocytes Relative: 28 %
Lymphs Abs: 3 10*3/uL (ref 0.7–4.0)
MCH: 28.2 pg (ref 26.0–34.0)
MCHC: 33.1 g/dL (ref 30.0–36.0)
MCV: 85.2 fL (ref 80.0–100.0)
Monocytes Absolute: 0.6 10*3/uL (ref 0.1–1.0)
Monocytes Relative: 6 %
Neutro Abs: 6.8 10*3/uL (ref 1.7–7.7)
Neutrophils Relative %: 64 %
Platelets: 380 10*3/uL (ref 150–400)
RBC: 4.61 MIL/uL (ref 3.87–5.11)
RDW: 12.8 % (ref 11.5–15.5)
WBC: 10.6 10*3/uL — ABNORMAL HIGH (ref 4.0–10.5)
nRBC: 0 % (ref 0.0–0.2)

## 2021-04-18 LAB — COMPREHENSIVE METABOLIC PANEL WITH GFR
ALT: 74 U/L — ABNORMAL HIGH (ref 0–44)
AST: 166 U/L — ABNORMAL HIGH (ref 15–41)
Albumin: 4.6 g/dL (ref 3.5–5.0)
Alkaline Phosphatase: 60 U/L (ref 38–126)
Anion gap: 10 (ref 5–15)
BUN: 17 mg/dL (ref 6–20)
CO2: 22 mmol/L (ref 22–32)
Calcium: 9.3 mg/dL (ref 8.9–10.3)
Chloride: 105 mmol/L (ref 98–111)
Creatinine, Ser: 0.69 mg/dL (ref 0.44–1.00)
GFR, Estimated: >60 mL/min
Glucose, Bld: 148 mg/dL — ABNORMAL HIGH (ref 70–99)
Potassium: 3.2 mmol/L — ABNORMAL LOW (ref 3.5–5.1)
Sodium: 137 mmol/L (ref 135–145)
Total Bilirubin: 0.6 mg/dL (ref 0.3–1.2)
Total Protein: 7.8 g/dL (ref 6.5–8.1)

## 2021-04-18 LAB — CK: Total CK: 6045 U/L — ABNORMAL HIGH (ref 38–234)

## 2021-04-18 LAB — POC URINE PREG, ED: Preg Test, Ur: NEGATIVE

## 2021-04-18 MED ORDER — SODIUM CHLORIDE 0.9 % IV SOLN
1000.0000 mL | Freq: Once | INTRAVENOUS | Status: AC
  Administered 2021-04-18: 1000 mL via INTRAVENOUS

## 2021-04-18 NOTE — ED Provider Notes (Signed)
[facility] Emergency Department Provider Note   ____________________________________________    I have reviewed the triage vital signs and the nursing notes.   HISTORY  Chief Complaint Arm Swelling     HPI Kalila Adkison is a 35 y.o. female who presents with complaints of bilateral arm swelling.  Patient reports on Monday 6 days ago she went to a burn [employer] "arm day ".  She reports typically she has been a frequent exerciser however she has recently had a baby so she is not participated in some time.  She reports she was mildly sore during the week but then today in the afternoon noted swelling to her upper arms bilaterally as well as pain and cramping.  No other complaints.  Normal urine  No past medical history on file.  Patient Active Problem List   Diagnosis Date Noted  . Term pregnancy 06/16/2020  . Pregnancy 09/28/2016    Past Surgical History:  Procedure Laterality Date  . WISDOM TOOTH EXTRACTION  2006    Prior to Admission medications   Medication Sig Start Date End Date Taking? Authorizing Provider  ibuprofen (ADVIL,MOTRIN) 600 MG tablet Take 1 tablet (600 mg total) by mouth every 6 (six) hours. Patient not taking: Reported on 06/16/2020 09/30/16   Richarda Overlie, MD  Prenatal Vit-Fe Fumarate-FA (PRENATAL MULTIVITAMIN) TABS tablet Take 1 tablet by mouth daily at 12 noon.    [provider]     Allergies Patient has no known allergies.  Family History  Problem Relation Age of Onset  . Hypertension Mother   . Thyroid disease Mother     Social History Social History   Tobacco Use  . Smoking status: Never Smoker  . Smokeless tobacco: Never Used  Vaping Use  . Vaping Use: Never used  Substance Use Topics  . Alcohol use: No  . Drug use: No    Review of Systems  Constitutional: No fever/chills Eyes: No visual changes.  ENT: No sore throat. Cardiovascular: Denies chest pain. Respiratory: Denies  shortness of breath. Gastrointestinal: No abdominal pain.  Genitourinary: Negative for dysuria. Musculoskeletal: As above Skin: Negative for rash. Neurological: Negative for headaches or weakness   ____________________________________________   PHYSICAL EXAM:  VITAL SIGNS: ED Triage Vitals  Enc Vitals Group     BP 04/18/21 2119 (!) 156/103     Pulse Rate 04/18/21 2119 92     Resp 04/18/21 2119 16     Temp 04/18/21 2119 98.5 F (36.9 C)     Temp Source 04/18/21 2119 Oral     SpO2 04/18/21 2119 98 %     Weight 04/18/21 2120 59 kg (130 lb)     Height 04/18/21 2120 1.524 m (5')     Head Circumference --      Peak Flow --      Pain Score 04/18/21 2120 0     Pain Loc --      Pain Edu? --      Excl. in GC? --     Constitutional: Alert and oriented. No acute distress. Pleasant and interactive   Mouth/Throat: Mucous membranes are moist.    Cardiovascular: Normal rate, regular rhythm. Peri Jefferson peripheral circulation. Respiratory: Normal respiratory effort.  No retractions. Gastrointestinal: Soft and nontender. No distention.   Musculoskeletal: Mild swelling to the upper arms bilaterally, no erythema, warm and well perfused, normal pulses distally Neurologic:  Normal speech and language.  Skin:  Skin is warm, dry and intact. No rash  noted. Psychiatric: Mood and affect are normal. Speech and behavior are normal.  ____________________________________________   LABS (all labs ordered are listed, but only abnormal results are displayed)  Labs Reviewed  CK - Abnormal; Notable for the following components:      Result Value   Total CK 6,045 (*)    All other components within normal limits  CBC WITH DIFFERENTIAL/PLATELET - Abnormal; Notable for the following components:   WBC 10.6 (*)    All other components within normal limits  COMPREHENSIVE METABOLIC PANEL - Abnormal; Notable for the following components:   Potassium 3.2 (*)    Glucose, Bld 148 (*)    AST 166 (*)     ALT 74 (*)    All other components within normal limits  POC URINE PREG, ED   ____________________________________________  EKG   ____________________________________________  RADIOLOGY  Ultrasound reviewed by me ____________________________________________   PROCEDURES  Procedure(s) performed: No  Procedures   Critical Care performed: No ____________________________________________   INITIAL IMPRESSION / ASSESSMENT AND PLAN / ED COURSE  Pertinent labs & imaging results that were available during my care of the patient were reviewed by me and considered in my medical decision making (see chart for details).  Patient presents with mild swelling to the upper arms and soreness after significant workout.  She had noted that right arm was worse than the left, ultrasound obtained, results pending, reviewed by me, no clear evidence of DVT.  CK is elevated at 6000.  Creatinine is normal.  Will give 2 L of IV fluids and recheck CK afterwards.  If decreasing likely okay for discharge, if increasing will likely need admission  Have asked my colleague to follow-up on repeat CK    ____________________________________________   FINAL CLINICAL IMPRESSION(S) / ED DIAGNOSES  Final diagnoses:  Non-traumatic rhabdomyolysis        Note:  This document was prepared using Dragon voice recognition software and may include unintentional dictation errors.   Jene Every, MD 04/18/21 [phone]

## 2021-04-18 NOTE — ED Notes (Signed)
Spoke with dr. Derrill Kay regarding pt's presenting symptoms, orders received.

## 2021-04-18 NOTE — ED Triage Notes (Addendum)
Pt with right upper arm swelling that began at 1630 today. Pt now has the beginning of left upper arm swelling that began approx 20 min pta. Pt without fever, redness. No color change, sensation, pulse changed noted in extremities. Pt denies pain. Pt states did exercise arms last week.

## 2021-04-19 LAB — CK: Total CK: 4094 U/L — ABNORMAL HIGH (ref 38–234)

## 2021-04-19 MED ORDER — DIAZEPAM 2 MG PO TABS: 2.0000 mg | ORAL_TABLET | Freq: Three times a day (TID) | ORAL | 0 refills | Status: AC | PRN

## 2021-04-19 NOTE — Discharge Instructions (Addendum)
1.  You may take Tylenol and/or ibuprofen as needed for pain. 2.  You may take Valium as needed for muscle spasms.  Pump and dump the first ounce of breastmilk if you are taking Valium. 3.  Return to the ER for worsening symptoms, persistent vomiting, difficulty breathing or other concerns

## 2021-04-19 NOTE — ED Notes (Signed)
Pt presenting to ED with bilateral upper arm swelling that started earlier today. Pt's arms noted to be red and warm to the touch. R arm more swollen than L. Pt denies trauma to either arm. Pt's mom at bedside. AAO NAD VSS

## 2021-04-19 NOTE — ED Provider Notes (Signed)
-----------------------------------------   3:02 AM on 04/19/2021 -----------------------------------------  After completion of IV fluids, CK was repeated which is now trending down, 4094.  Patient feeling better and eager for discharge home.  Will prescribe low-dose Valium for muscle cramps.  Patient is breast-feeding; she will express first ounce of breastmilk prior to feeding.  Strict return precautions given.  Patient verbalizes understanding agrees with plan of care.   Irean Hong, MD 04/19/21 [phone]

## 2021-06-21 IMAGING — MR MR BREAST BILAT W/O CM
4 series · 28 of 48 positions shown · non-contrast
Comparison: Previous exam(s).

CLINICAL DATA: 33-year-old pregnant female (2nd trimester) with
LEFT nipple discharge.

LABS:  None
EXAM:
BILATERAL BREAST MRI WITHOUT CONTRAST
TECHNIQUE: Multiplanar, multisequence MR images of both breasts were obtained

[Series 4: t2_tirm_tra ipat (a-p) · axial · 3.0mm · 0.66mm/px · z∈[-81,+81]mm · 7 of 55 slices shown]
[im 1/55]
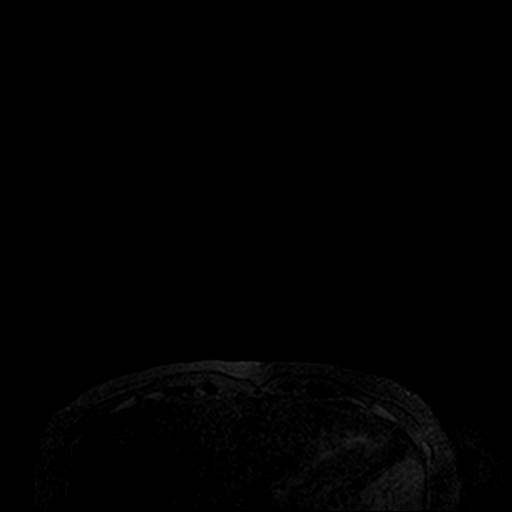
[im 10/55]
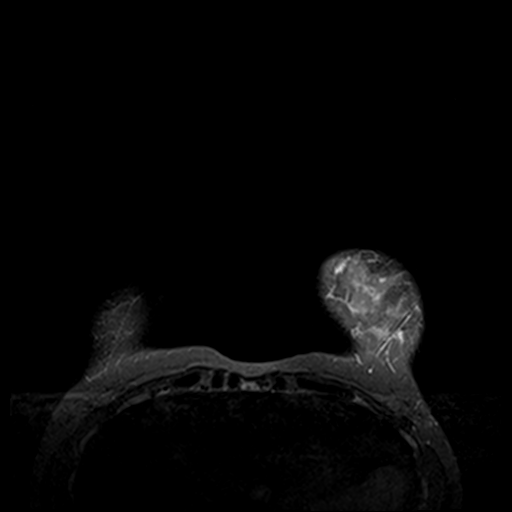
[im 19/55]
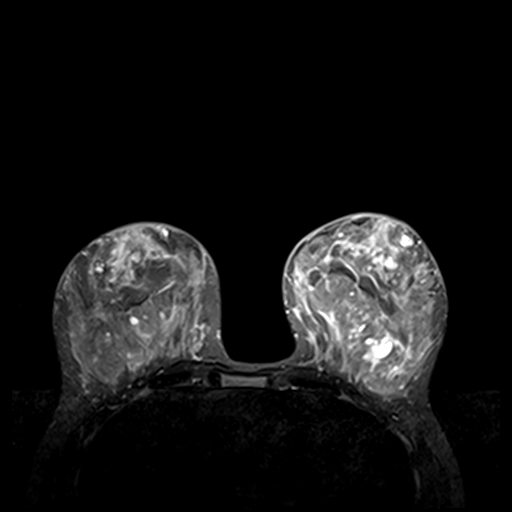
[im 28/55]
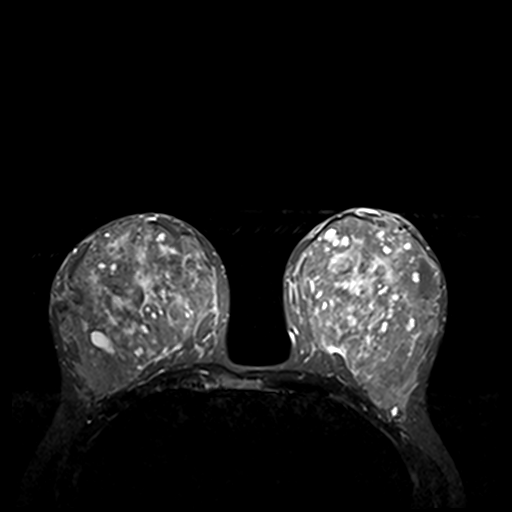
[im 37/55]
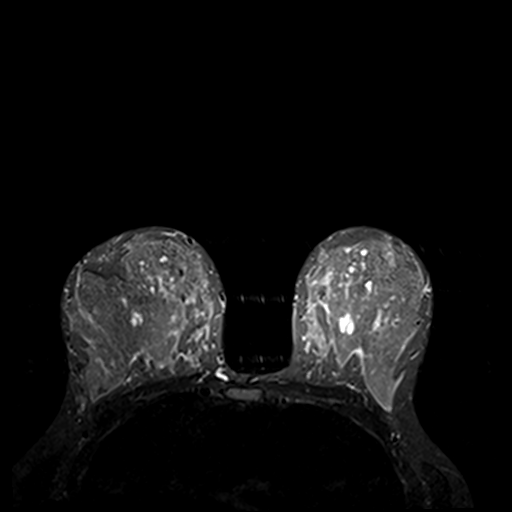
[im 46/55]
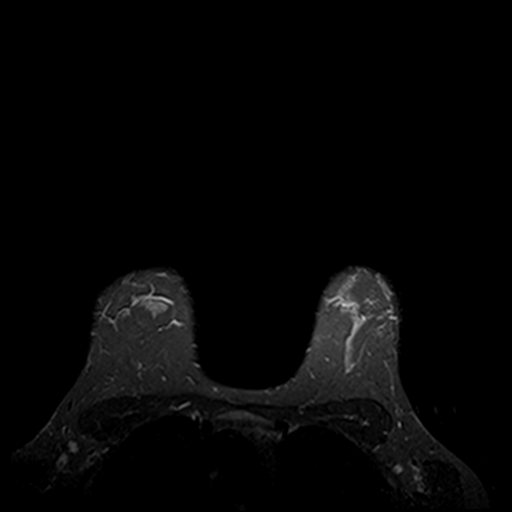
[im 55/55]
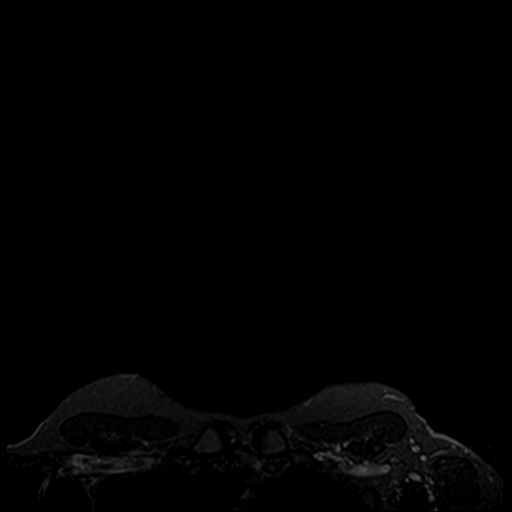

[Series 5: fl3d pre-cm no · axial · non-contrast · 1.2mm · 0.89mm/px · z∈[-76,+75]mm · 11 of 144 slices shown]
[im 9/144]
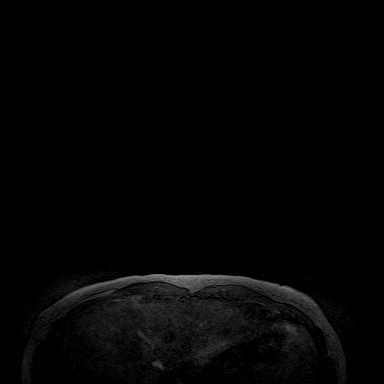
[im 18/144]
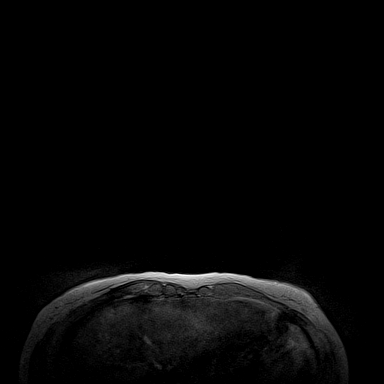
[im 27/144]
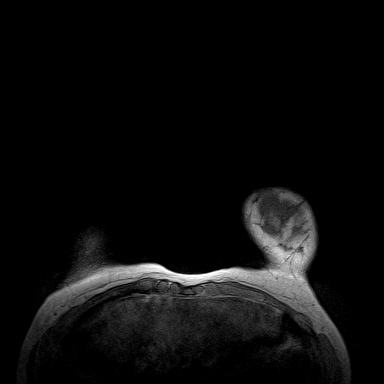
[im 45/144]
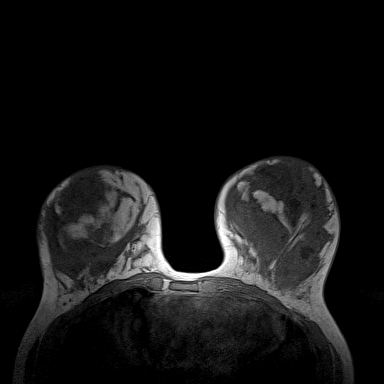
[im 63/144]
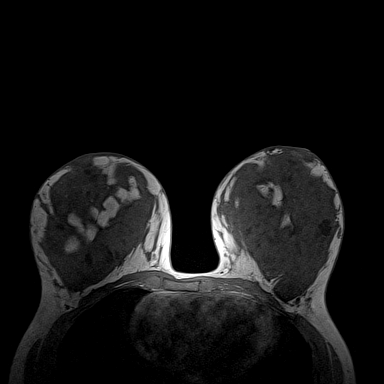
[im 72/144]
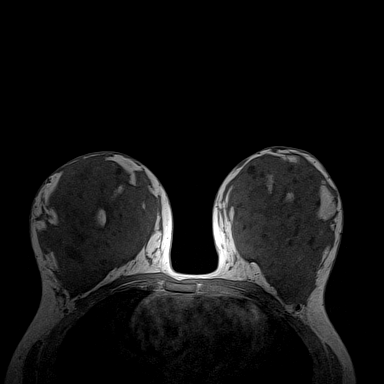
[im 81/144]
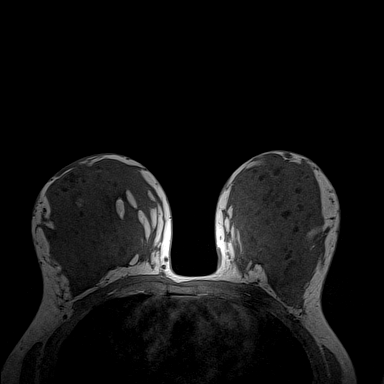
[im 99/144]
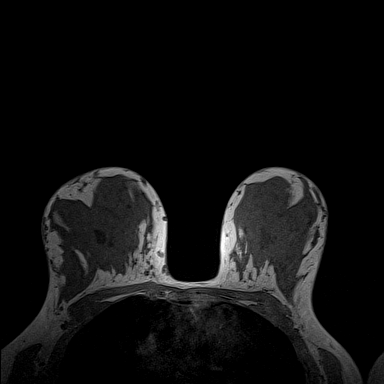
[im 117/144]
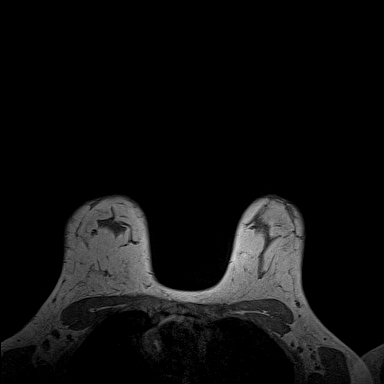
[im 126/144]
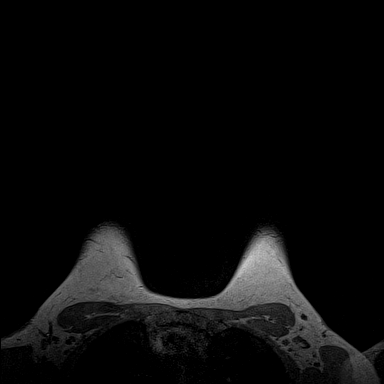
[im 135/144]
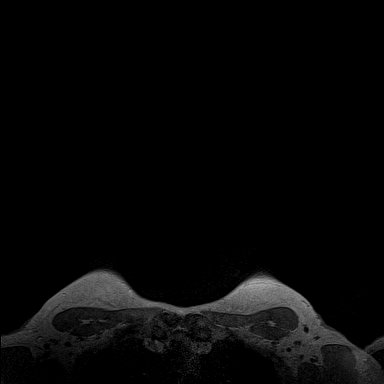

[Series 6: fl3d pre-cm · axial · non-contrast · 1.2mm · 0.94mm/px · z∈[-77,+64]mm · 7 of 144 slices shown]
[im 9/144]
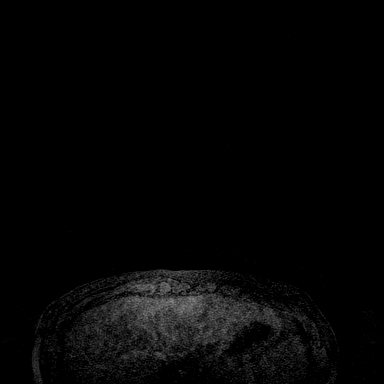
[im 18/144]
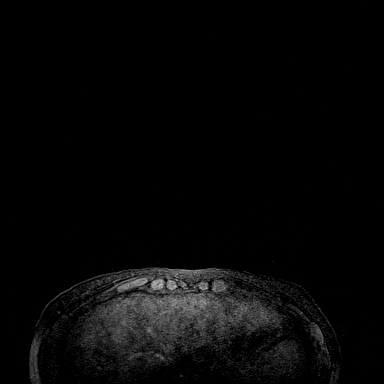
[im 27/144]
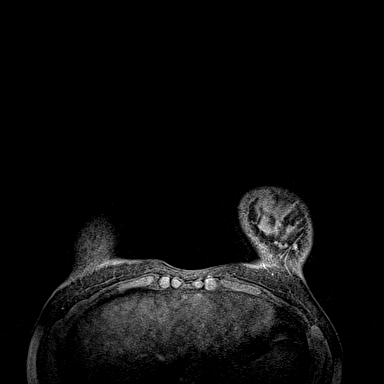
[im 45/144]
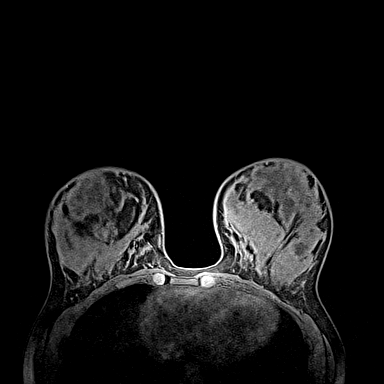
[im 63/144]
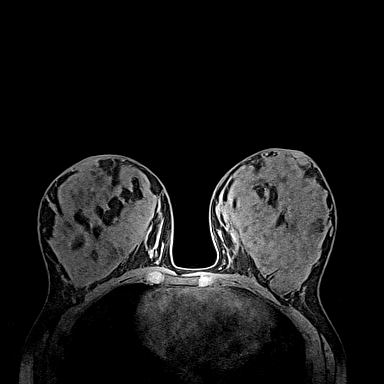
[im 72/144]
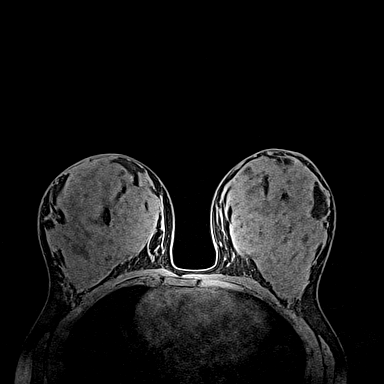
[im 126/144]
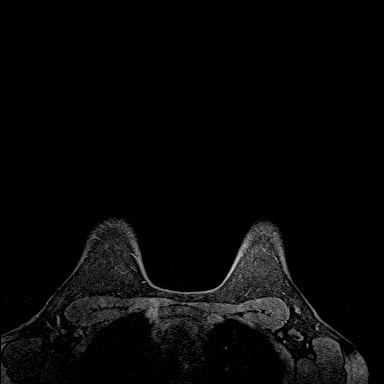

[Series 7: T2 fat-sat · axial · 3.0mm · 0.89mm/px · z∈[-54,+54]mm · 3 of 55 slices shown]
[im 10/55]
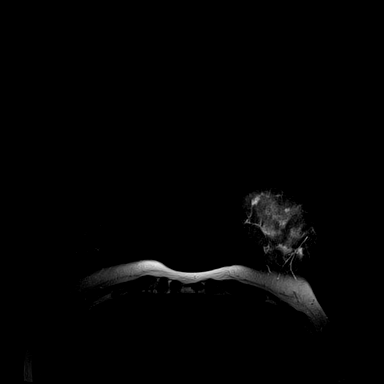
[im 28/55]
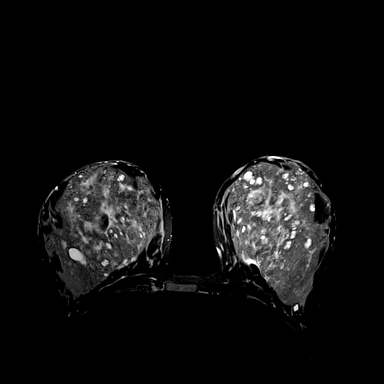
[im 46/55]
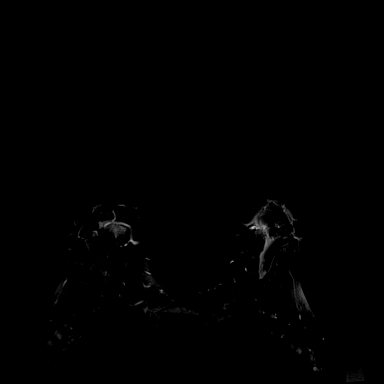

[28 of 48 positions shown; findings below may reference images not displayed]

Three-dimensional MR images were rendered by post-processing of the
original MR data on an independent workstation. The
three-dimensional MR images were interpreted, and findings are
reported in the following complete MRI report for this study. Three
dimensional images were evaluated at the independent [employer]
workstation
FINDINGS: Please note that sensitivity is significantly decreased given that
the patient cannot receive intravenous gadolinium due to her
pregnancy.

Breast composition: d. Extreme fibroglandular tissue.

Right breast: Innumerable cysts are present throughout the RIGHT
breast. No other abnormalities are noted.

Left breast: Innumerable cysts are present throughout the LEFT
breast. No other abnormalities are noted.

Lymph nodes: No abnormal appearing lymph nodes.

Ancillary findings:  None.
IMPRESSION: Innumerable cysts within both breasts.  No other gross abnormality.

Please note very limited sensitivity as the patient could not
receive intravenous contrast due to her pregnancy.

RECOMMENDATION:
Clinical follow-up as indicated. If the patient continues to have
indeterminate or suspicious nipple discharge after delivery,
consider breast MRI with and without contrast.

BI-RADS CATEGORY  2: Benign.

## 2022-12-11 ENCOUNTER — Ambulatory Visit
Admission: EM | Admit: 2022-12-11 | Discharge: 2022-12-11 | Disposition: A | Discharge status: Home / Self Care | Payer: No Typology Code available for payment source | Attending: Urgent Care | Admitting: Urgent Care

## 2022-12-11 DIAGNOSIS — J019 Acute sinusitis, unspecified: Secondary | ICD-10-CM

## 2022-12-11 DIAGNOSIS — B9689 Other specified bacterial agents as the cause of diseases classified elsewhere: Secondary | ICD-10-CM

## 2022-12-11 MED ORDER — PREDNISONE 20 MG PO TABS: ORAL_TABLET | ORAL | 0 refills | Status: AC

## 2022-12-11 MED ORDER — AMOXICILLIN-POT CLAVULANATE 875-125 MG PO TABS: 1.0000 | ORAL_TABLET | Freq: Two times a day (BID) | ORAL | 0 refills | Status: AC

## 2022-12-11 NOTE — ED Triage Notes (Signed)
Pt.presents to UC w/ c/o nasal congestion and sinus pressure for the past 3 weeks.

## 2022-12-11 NOTE — ED Provider Notes (Signed)
[name]    CSN: [id number] Arrival date & time: 12/11/22  1354      History   Chief Complaint Chief Complaint  Patient presents with   Nasal Congestion   Facial Pain    HPI Paula [name] is a 37 y.o. female.   HPI  Presents to urgent care with complaint of nasal congestion and sinus pressure x 3 weeks.  She endorses some right ear pain starting today.  Also reports significant mucus production from her chest.  History reviewed. No pertinent past medical history.  Patient Active Problem List   Diagnosis Date Noted   Term pregnancy 06/16/2020   Pregnancy 09/28/2016    Past Surgical History:  Procedure Laterality Date   WISDOM TOOTH EXTRACTION  2006    OB History     Gravida  2   Para  2   Term  2   Preterm      AB      Living  2      SAB      IAB      Ectopic      Multiple  0   Live Births  2            Home Medications    Prior to Admission medications   Medication Sig Start Date End Date Taking? Authorizing Provider  diazepam (VALIUM) 2 MG tablet Take 1 tablet (2 mg total) by mouth every 8 (eight) hours as needed for muscle spasms. 04/19/21   Irean Hong, MD  ibuprofen (ADVIL,MOTRIN) 600 MG tablet Take 1 tablet (600 mg total) by mouth every 6 (six) hours. Patient not taking: Reported on 06/16/2020 09/30/16   Richarda Overlie, MD  Prenatal Vit-Fe Fumarate-FA (PRENATAL MULTIVITAMIN) TABS tablet Take 1 tablet by mouth daily at 12 noon.    [provider]    Family History Family History  Problem Relation Age of Onset   Hypertension Mother    Thyroid disease Mother     Social History Social History   Tobacco Use   Smoking status: Never   Smokeless tobacco: Never  Vaping Use   Vaping Use: Never used  Substance Use Topics   Alcohol use: No   Drug use: No     Allergies   Patient has no known allergies.   Review of Systems Review of Systems   Physical Exam Triage Vital Signs ED Triage Vitals  [12/11/22 1504]  Enc Vitals Group     BP 113/72     Pulse Rate 63     Resp 17     Temp 98 F (36.7 C)     Temp src      SpO2 97 %     Weight      Height      Head Circumference      Peak Flow      Pain Score 0     Pain Loc      Pain Edu?      Excl. in GC?    No data found.  Updated Vital Signs BP 113/72   Pulse 63   Temp 98 F (36.7 C)   Resp 17   LMP 12/02/2022   SpO2 97%   Breastfeeding No   Visual Acuity Right Eye Distance:   Left Eye Distance:   Bilateral Distance:    Right Eye Near:   Left Eye Near:    Bilateral Near:     Physical Exam Vitals reviewed.  Constitutional:  Appearance: Normal appearance.  HENT:     Right Ear: A middle ear effusion is present.     Left Ear: Tympanic membrane normal.     Ears:     Comments: Right EAC is erythematous at the TM. Skin:    General: Skin is warm and dry.  Neurological:     General: No focal deficit present.     Mental Status: She is alert and oriented to person, place, and time.  Psychiatric:        Mood and Affect: Mood normal.        Behavior: Behavior normal.      UC Treatments / Results  Labs (all labs ordered are listed, but only abnormal results are displayed) Labs Reviewed - No data to display  EKG   Radiology No results found.  Procedures Procedures (including critical care time)  Medications Ordered in UC Medications - No data to display  Initial Impression / Assessment and Plan / UC Course  I have reviewed the triage vital signs and the nursing notes.  Pertinent labs & imaging results that were available during my care of the patient were reviewed by me and considered in my medical decision making (see chart for details).   Patient is afebrile here without recent antipyretics. Satting well on room air. Overall is well appearing, well hydrated, without respiratory distress.  Right EAC is erythematous at the TM.  There is a right middle ear effusion.  Suspect bacterial  sinusitis and will treat with Augmentin.  Also prescribing a course of prednisone to reduce sinus inflammation given her severe symptoms and pain.  Final Clinical Impressions(s) / UC Diagnoses   Final diagnoses:  None   Discharge Instructions   None    ED Prescriptions   None    PDMP not reviewed this encounter.   Rose Phi, [place] 12/11/22 [phone]

## 2022-12-11 NOTE — Discharge Instructions (Addendum)
Follow up here or with your primary care provider if your symptoms are worsening or not improving with treatment.
